# Patient Record
Sex: Male | Born: 1945 | Race: White | Hispanic: No | Marital: Married | Smoking: Former smoker
Health system: Southern US, Community
[De-identification: ages and names within clinical notes are randomized; demographics above are authoritative.]

## PROBLEM LIST (undated history)

## (undated) DIAGNOSIS — D1800 Hemangioma unspecified site: Secondary | ICD-10-CM

## (undated) DIAGNOSIS — C099 Malignant neoplasm of tonsil, unspecified: Secondary | ICD-10-CM

## (undated) DIAGNOSIS — C801 Malignant (primary) neoplasm, unspecified: Secondary | ICD-10-CM

## (undated) DIAGNOSIS — M1990 Unspecified osteoarthritis, unspecified site: Secondary | ICD-10-CM

## (undated) DIAGNOSIS — Z974 Presence of external hearing-aid: Secondary | ICD-10-CM

## (undated) DIAGNOSIS — M75101 Unspecified rotator cuff tear or rupture of right shoulder, not specified as traumatic: Secondary | ICD-10-CM

## (undated) DIAGNOSIS — F4310 Post-traumatic stress disorder, unspecified: Secondary | ICD-10-CM

## (undated) DIAGNOSIS — K219 Gastro-esophageal reflux disease without esophagitis: Secondary | ICD-10-CM

## (undated) DIAGNOSIS — K08109 Complete loss of teeth, unspecified cause, unspecified class: Secondary | ICD-10-CM

## (undated) DIAGNOSIS — M5412 Radiculopathy, cervical region: Secondary | ICD-10-CM

## (undated) DIAGNOSIS — I639 Cerebral infarction, unspecified: Secondary | ICD-10-CM

## (undated) DIAGNOSIS — I6529 Occlusion and stenosis of unspecified carotid artery: Secondary | ICD-10-CM

## (undated) DIAGNOSIS — I10 Essential (primary) hypertension: Secondary | ICD-10-CM

## (undated) DIAGNOSIS — E039 Hypothyroidism, unspecified: Secondary | ICD-10-CM

## (undated) DIAGNOSIS — I2510 Atherosclerotic heart disease of native coronary artery without angina pectoris: Secondary | ICD-10-CM

## (undated) DIAGNOSIS — R7303 Prediabetes: Secondary | ICD-10-CM

## (undated) DIAGNOSIS — I872 Venous insufficiency (chronic) (peripheral): Secondary | ICD-10-CM

## (undated) HISTORY — PX: CORONARY ANGIOPLASTY WITH STENT PLACEMENT: SHX49

## (undated) HISTORY — PX: COLONOSCOPY: SHX174

## (undated) HISTORY — PX: OTHER SURGICAL HISTORY: SHX169

## (undated) HISTORY — PX: RESECTION TONSIL RADICAL: SUR1254

## (undated) HISTORY — PX: CAROTID ENDARTERECTOMY: SUR193

---

## 1978-06-22 DIAGNOSIS — R569 Unspecified convulsions: Secondary | ICD-10-CM

## 1978-06-22 HISTORY — DX: Unspecified convulsions: R56.9

## 2000-04-27 ENCOUNTER — Ambulatory Visit (HOSPITAL_COMMUNITY): Admission: RE | Admit: 2000-04-27 | Discharge: 2000-04-27 | Payer: Self-pay | Admitting: Orthopedic Surgery

## 2000-04-28 ENCOUNTER — Encounter: Payer: Self-pay | Admitting: Orthopedic Surgery

## 2005-06-26 ENCOUNTER — Ambulatory Visit: Payer: Self-pay | Admitting: Otolaryngology

## 2005-07-15 ENCOUNTER — Other Ambulatory Visit: Payer: Self-pay

## 2005-07-21 ENCOUNTER — Ambulatory Visit: Payer: Self-pay | Admitting: Otolaryngology

## 2005-07-30 ENCOUNTER — Ambulatory Visit: Payer: Self-pay | Admitting: Radiation Oncology

## 2005-07-31 ENCOUNTER — Ambulatory Visit: Payer: Self-pay | Admitting: Radiation Oncology

## 2005-08-11 ENCOUNTER — Ambulatory Visit: Payer: Self-pay | Admitting: Vascular Surgery

## 2005-08-20 ENCOUNTER — Ambulatory Visit: Payer: Self-pay | Admitting: Radiation Oncology

## 2005-09-20 ENCOUNTER — Ambulatory Visit: Payer: Self-pay | Admitting: Radiation Oncology

## 2005-10-20 ENCOUNTER — Ambulatory Visit: Payer: Self-pay | Admitting: Radiation Oncology

## 2005-11-20 ENCOUNTER — Ambulatory Visit: Payer: Self-pay | Admitting: Radiation Oncology

## 2005-12-20 ENCOUNTER — Ambulatory Visit: Payer: Self-pay | Admitting: Radiation Oncology

## 2006-01-20 ENCOUNTER — Ambulatory Visit: Payer: Self-pay | Admitting: Radiation Oncology

## 2006-02-26 ENCOUNTER — Ambulatory Visit: Payer: Self-pay | Admitting: Oncology

## 2006-03-02 ENCOUNTER — Ambulatory Visit: Payer: Self-pay | Admitting: Oncology

## 2006-03-22 ENCOUNTER — Ambulatory Visit: Payer: Self-pay | Admitting: Oncology

## 2006-04-22 ENCOUNTER — Ambulatory Visit: Payer: Self-pay | Admitting: Oncology

## 2006-05-22 ENCOUNTER — Ambulatory Visit: Payer: Self-pay | Admitting: Oncology

## 2006-06-24 ENCOUNTER — Ambulatory Visit: Payer: Self-pay | Admitting: Oncology

## 2006-06-28 ENCOUNTER — Ambulatory Visit: Payer: Self-pay | Admitting: Vascular Surgery

## 2006-07-08 ENCOUNTER — Ambulatory Visit: Payer: Self-pay | Admitting: Otolaryngology

## 2006-07-23 ENCOUNTER — Ambulatory Visit: Payer: Self-pay | Admitting: Oncology

## 2006-08-21 ENCOUNTER — Ambulatory Visit: Payer: Self-pay | Admitting: Internal Medicine

## 2006-08-25 ENCOUNTER — Ambulatory Visit: Payer: Self-pay | Admitting: Oncology

## 2006-09-21 ENCOUNTER — Ambulatory Visit: Payer: Self-pay | Admitting: Internal Medicine

## 2006-09-21 ENCOUNTER — Ambulatory Visit: Payer: Self-pay | Admitting: Oncology

## 2006-11-21 ENCOUNTER — Ambulatory Visit: Payer: Self-pay | Admitting: Oncology

## 2006-12-10 ENCOUNTER — Ambulatory Visit: Payer: Self-pay | Admitting: Internal Medicine

## 2006-12-14 ENCOUNTER — Ambulatory Visit: Payer: Self-pay | Admitting: Oncology

## 2006-12-21 ENCOUNTER — Ambulatory Visit: Payer: Self-pay | Admitting: Internal Medicine

## 2006-12-21 ENCOUNTER — Ambulatory Visit: Payer: Self-pay | Admitting: Oncology

## 2006-12-27 ENCOUNTER — Ambulatory Visit: Payer: Self-pay | Admitting: Oncology

## 2007-01-03 ENCOUNTER — Ambulatory Visit: Payer: Self-pay | Admitting: Oncology

## 2007-01-21 ENCOUNTER — Ambulatory Visit: Payer: Self-pay | Admitting: Oncology

## 2007-05-06 IMAGING — PT NM PET TUM IMG INITIAL (PI) SKULL BASE T - THIGH
4 series · 25 of 25 positions shown · non-contrast
Comparison: none

REASON FOR EXAM: Squamous cell CA of tonsil, staging of tonsillar CA
COMMENTS:

[Series 4: pet headneck · axial · 2.0mm · 2.03mm/px · z∈[-248,-86]mm · 16 of 109 slices shown]
[im 1/109]
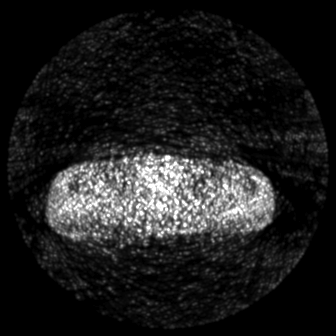
[im 8/109]
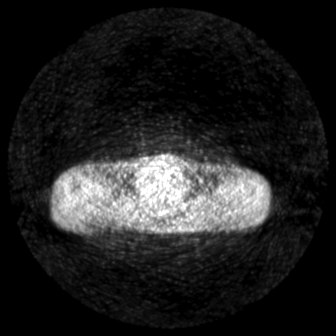
[im 15/109]
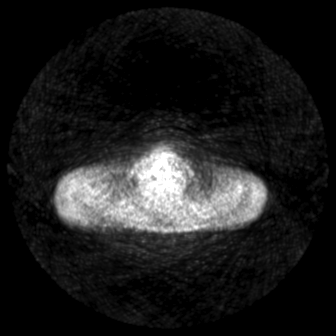
[im 22/109]
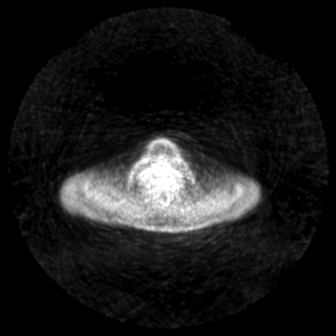
[im 29/109]
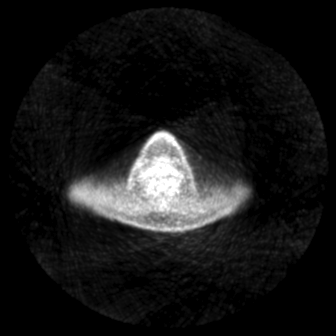
[im 37/109]
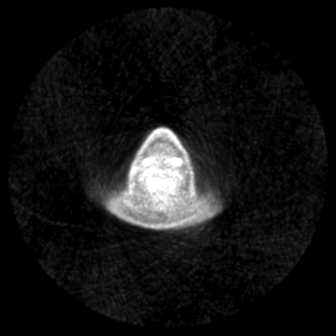
[im 44/109]
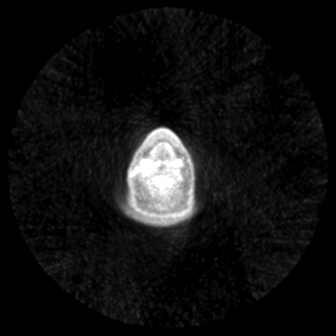
[im 51/109]
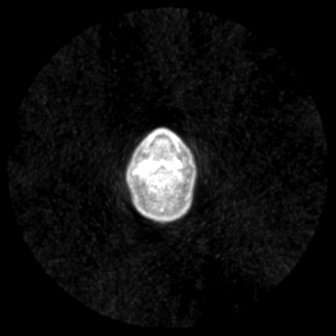
[im 58/109]
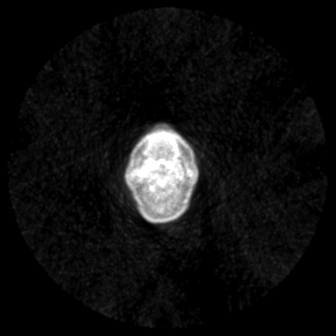
[im 65/109]
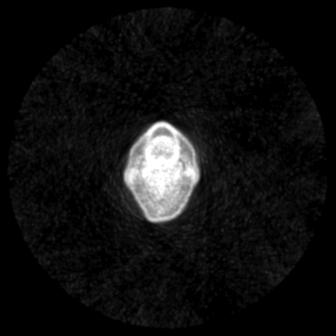
[im 73/109]
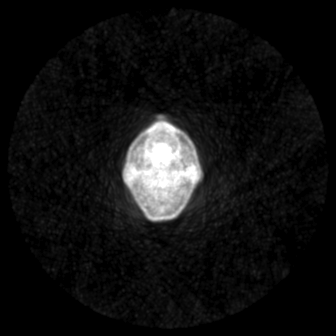
[im 80/109]
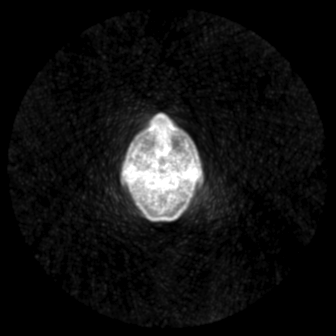
[im 87/109]
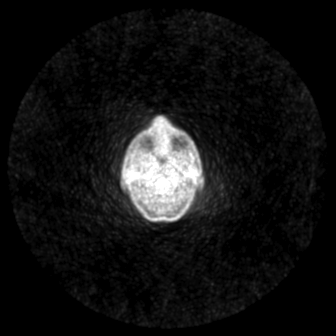
[im 94/109]
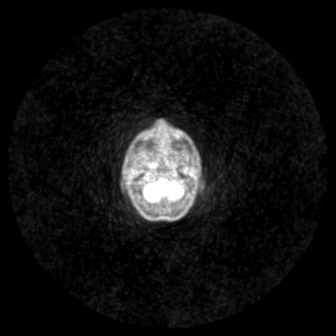
[im 101/109]
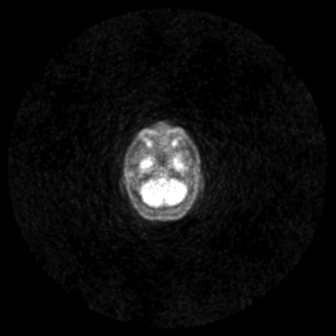
[im 109/109]
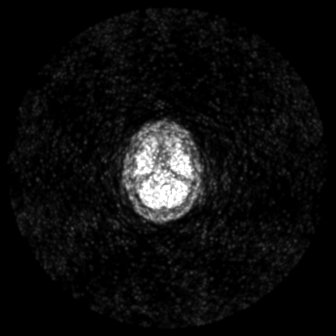

[Series 604: coronals · coronal · 4.0mm · 2.03mm/px · 3 of 21 slices shown]
[im 1/21]
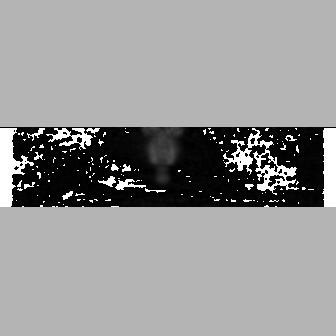
[im 11/21]
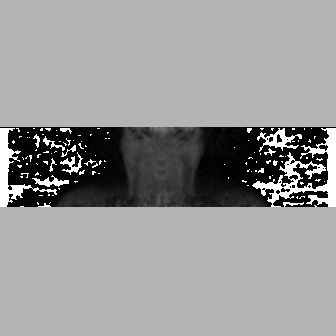
[im 21/21]
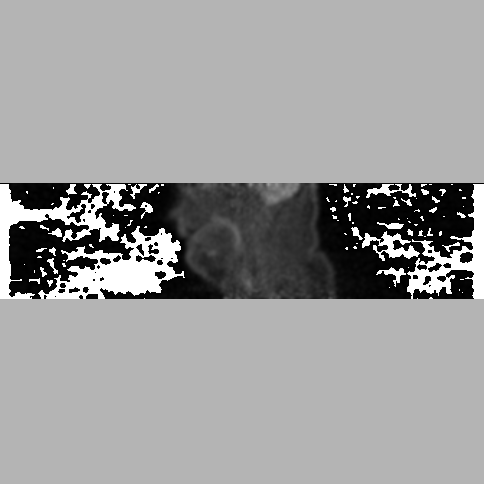

[Series 606: sagittals · axial · 2.0mm · 1.41mm/px · z∈[-168,+173]mm · 3 of 21 slices shown]
[im 1/21]
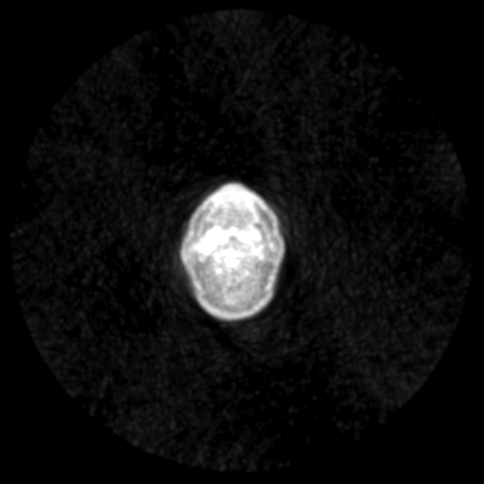
[im 11/21]
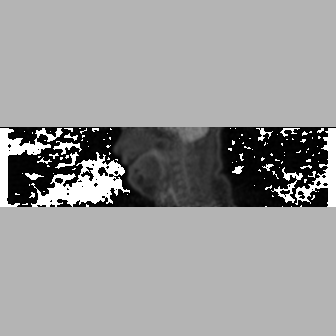
[im 21/21]
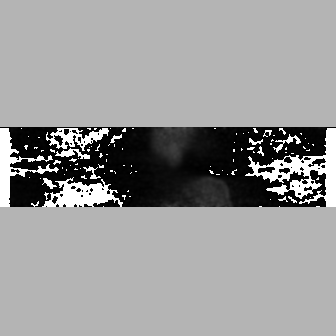

[Series 608: axials · axial · 4.0mm · 2.03mm/px · z∈[-241,+173]mm · 3 of 20 slices shown]
[im 1/20]
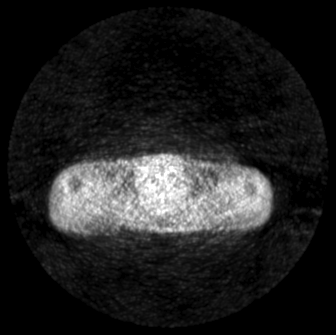
[im 10/20]
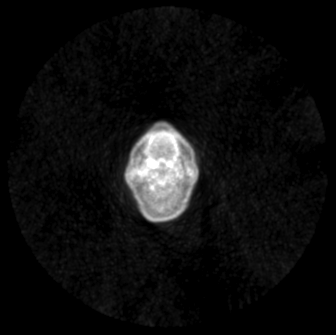
[im 20/20]
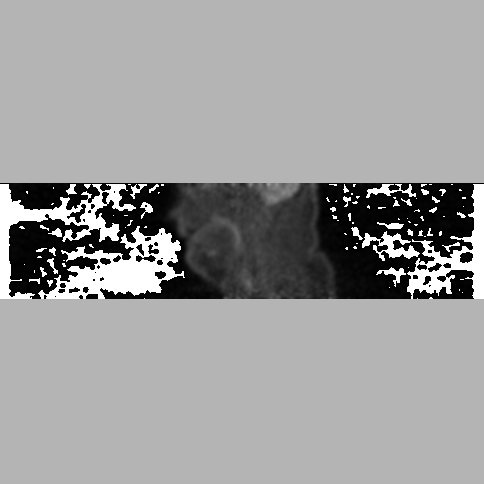

[25 of 25 positions shown; findings below may reference images not displayed]

PROCEDURE:     PET - PET/CT INIT STAG HEAD/NECK CA  - July 31, 2005 [DATE]

RESULT:     Total body PET/CT is obtained status post intravenous
administration of 15.53 mCi of F18. Included with the study is a
non-attenuated total body CT scan as well as coned down images of the neck.

The fasting blood glucose was measured at 90 mg/dl. Appropriate blood pool
images are demonstrated in the region of the brain, liver, heart, kidneys
and urinary bladder. A focal area of hypermetabolic activity is demonstrated
along the tonsillar region on the RIGHT. This area demonstrates an SVU of
3.3 and is consistent with the patient's history of tonsillar carcinoma. A
second focal area of hypermetabolic activity projects within the pelvis on
the RIGHT at the level of the pelvic inlet just anterior to the psoas
muscle. This appears to project in the expected course of the ureter and
demonstrates an SVU of 2.8. Diagnostic considerations at this time is urine
within the distal RIGHT ureter. No definitive masses or nodules are
associated with this area. Attention to this region on subsequent imaging is
recommended. No further focal areas of abnormal hypermetabolic activity are
identified.
IMPRESSION: 1.     Region of hypermetabolic activity within the RIGHT tonsillar region
consistent with neoplastic disease until proven otherwise indicative of the
patient's history.
2.     Findings likely representing urine within the distal RIGHT ureter as
described above. The ureter does appear to be ectatic in this region.
Alternatively, a lymph node within this area cannot be completely excluded
though, as stated above, this area appears to be consistent with radiotracer
within the distal ureter. This finding can be correlated with intravenous
urogram to confirm this is a region of focal ectasia of the RIGHT ureter
versus a small lymph node considering the patient's history. This is if
clinically warranted.
3.     No further regions of abnormal hypermetabolic activity are
appreciated.

## 2007-05-23 ENCOUNTER — Ambulatory Visit: Payer: Self-pay | Admitting: Oncology

## 2007-06-02 ENCOUNTER — Ambulatory Visit: Payer: Self-pay | Admitting: Oncology

## 2007-06-23 ENCOUNTER — Ambulatory Visit: Payer: Self-pay | Admitting: Oncology

## 2007-11-21 ENCOUNTER — Ambulatory Visit: Payer: Self-pay | Admitting: Oncology

## 2007-11-29 ENCOUNTER — Ambulatory Visit: Payer: Self-pay | Admitting: Oncology

## 2007-12-01 ENCOUNTER — Ambulatory Visit: Payer: Self-pay | Admitting: Oncology

## 2007-12-21 ENCOUNTER — Ambulatory Visit: Payer: Self-pay | Admitting: Oncology

## 2008-05-22 ENCOUNTER — Ambulatory Visit: Payer: Self-pay | Admitting: Oncology

## 2008-05-30 ENCOUNTER — Ambulatory Visit: Payer: Self-pay | Admitting: Oncology

## 2008-06-22 ENCOUNTER — Ambulatory Visit: Payer: Self-pay | Admitting: Oncology

## 2008-07-19 ENCOUNTER — Ambulatory Visit: Payer: Self-pay | Admitting: Otolaryngology

## 2009-03-28 ENCOUNTER — Ambulatory Visit: Payer: Self-pay | Admitting: Gastroenterology

## 2009-07-19 ENCOUNTER — Ambulatory Visit: Payer: Self-pay | Admitting: Otolaryngology

## 2011-01-21 ENCOUNTER — Emergency Department: Payer: Self-pay | Admitting: Emergency Medicine

## 2018-05-25 ENCOUNTER — Ambulatory Visit
Admission: RE | Admit: 2018-05-25 | Discharge: 2018-05-25 | Disposition: A | Discharge status: Home / Self Care | Payer: Non-veteran care | Source: Ambulatory Visit | Attending: Plastic Surgery | Admitting: Plastic Surgery

## 2018-05-25 ENCOUNTER — Other Ambulatory Visit: Payer: Self-pay | Admitting: Plastic Surgery

## 2018-05-25 DIAGNOSIS — M67441 Ganglion, right hand: Secondary | ICD-10-CM

## 2018-05-25 DIAGNOSIS — M19041 Primary osteoarthritis, right hand: Secondary | ICD-10-CM | POA: Diagnosis not present

## 2018-05-30 ENCOUNTER — Other Ambulatory Visit: Payer: Self-pay

## 2018-05-30 DIAGNOSIS — Z86010 Personal history of colonic polyps: Secondary | ICD-10-CM

## 2018-06-20 ENCOUNTER — Telehealth: Payer: Self-pay | Admitting: Gastroenterology

## 2018-06-20 NOTE — Telephone Encounter (Signed)
Pt wife is calling to r/s this procedure please call pt to r/s

## 2018-06-23 NOTE — Telephone Encounter (Signed)
Returned patients call to reschedule colonoscopy.  LVM for him to call me back to get a new date. 06/24/18 procedure has been canceled with Trish in Endoscopy.  No Cancellation Fee Wife called on 06/20/18  Thanks Marcelino Duster

## 2018-06-24 ENCOUNTER — Encounter: Admission: RE | Payer: Self-pay | Source: Home / Self Care

## 2018-06-24 ENCOUNTER — Ambulatory Visit: Admission: RE | Admit: 2018-06-24 | Payer: Non-veteran care | Source: Home / Self Care | Admitting: Gastroenterology

## 2018-06-24 SURGERY — COLONOSCOPY WITH PROPOFOL
Anesthesia: General

## 2018-10-20 ENCOUNTER — Encounter: Admission: RE | Payer: Self-pay | Source: Home / Self Care

## 2018-10-20 ENCOUNTER — Ambulatory Visit: Admission: RE | Admit: 2018-10-20 | Payer: Non-veteran care | Source: Home / Self Care | Admitting: Gastroenterology

## 2018-10-20 SURGERY — COLONOSCOPY WITH PROPOFOL
Anesthesia: General

## 2019-05-22 ENCOUNTER — Other Ambulatory Visit: Payer: Self-pay

## 2019-05-22 DIAGNOSIS — Z20822 Contact with and (suspected) exposure to covid-19: Secondary | ICD-10-CM

## 2019-05-25 LAB — NOVEL CORONAVIRUS, NAA: SARS-CoV-2, NAA: NOT DETECTED

## 2019-05-26 ENCOUNTER — Telehealth: Payer: Self-pay | Admitting: General Practice

## 2019-05-26 NOTE — Telephone Encounter (Signed)
Patient called in for his negative covid result

## 2019-07-06 ENCOUNTER — Ambulatory Visit: Payer: Non-veteran care | Attending: Internal Medicine

## 2019-07-06 DIAGNOSIS — Z20822 Contact with and (suspected) exposure to covid-19: Secondary | ICD-10-CM

## 2019-07-07 LAB — NOVEL CORONAVIRUS, NAA: SARS-CoV-2, NAA: NOT DETECTED

## 2019-07-08 ENCOUNTER — Telehealth: Payer: Self-pay | Admitting: General Practice

## 2019-07-08 NOTE — Telephone Encounter (Signed)
Pt called to get COVID results.  Made him aware those are negative.

## 2022-08-31 ENCOUNTER — Encounter: Payer: Self-pay | Admitting: *Deleted

## 2022-09-01 ENCOUNTER — Encounter: Payer: Self-pay | Admitting: *Deleted

## 2022-09-01 ENCOUNTER — Ambulatory Visit
Admission: RE | Admit: 2022-09-01 | Discharge: 2022-09-01 | Disposition: A | Discharge status: Home / Self Care | Payer: No Typology Code available for payment source | Attending: Gastroenterology | Admitting: Gastroenterology

## 2022-09-01 ENCOUNTER — Ambulatory Visit: Payer: No Typology Code available for payment source | Admitting: Anesthesiology

## 2022-09-01 ENCOUNTER — Encounter
Admission: RE | Disposition: A | Discharge status: Home / Self Care | Payer: Self-pay | Source: Home / Self Care | Attending: Gastroenterology

## 2022-09-01 DIAGNOSIS — E785 Hyperlipidemia, unspecified: Secondary | ICD-10-CM | POA: Diagnosis not present

## 2022-09-01 DIAGNOSIS — E039 Hypothyroidism, unspecified: Secondary | ICD-10-CM | POA: Insufficient documentation

## 2022-09-01 DIAGNOSIS — K640 First degree hemorrhoids: Secondary | ICD-10-CM | POA: Diagnosis not present

## 2022-09-01 DIAGNOSIS — K31A11 Gastric intestinal metaplasia without dysplasia, involving the antrum: Secondary | ICD-10-CM | POA: Diagnosis not present

## 2022-09-01 DIAGNOSIS — Z8673 Personal history of transient ischemic attack (TIA), and cerebral infarction without residual deficits: Secondary | ICD-10-CM | POA: Diagnosis not present

## 2022-09-01 DIAGNOSIS — K2970 Gastritis, unspecified, without bleeding: Secondary | ICD-10-CM | POA: Insufficient documentation

## 2022-09-01 DIAGNOSIS — K219 Gastro-esophageal reflux disease without esophagitis: Secondary | ICD-10-CM | POA: Insufficient documentation

## 2022-09-01 DIAGNOSIS — Z9049 Acquired absence of other specified parts of digestive tract: Secondary | ICD-10-CM | POA: Diagnosis not present

## 2022-09-01 DIAGNOSIS — Z1211 Encounter for screening for malignant neoplasm of colon: Secondary | ICD-10-CM | POA: Diagnosis present

## 2022-09-01 DIAGNOSIS — I2510 Atherosclerotic heart disease of native coronary artery without angina pectoris: Secondary | ICD-10-CM | POA: Diagnosis not present

## 2022-09-01 DIAGNOSIS — I10 Essential (primary) hypertension: Secondary | ICD-10-CM | POA: Insufficient documentation

## 2022-09-01 DIAGNOSIS — Z86010 Personal history of colonic polyps: Secondary | ICD-10-CM | POA: Insufficient documentation

## 2022-09-01 DIAGNOSIS — Z87891 Personal history of nicotine dependence: Secondary | ICD-10-CM | POA: Insufficient documentation

## 2022-09-01 HISTORY — DX: Atherosclerotic heart disease of native coronary artery without angina pectoris: I25.10

## 2022-09-01 HISTORY — PX: COLONOSCOPY WITH PROPOFOL: SHX5780

## 2022-09-01 HISTORY — PX: ESOPHAGOGASTRODUODENOSCOPY (EGD) WITH PROPOFOL: SHX5813

## 2022-09-01 HISTORY — DX: Unspecified osteoarthritis, unspecified site: M19.90

## 2022-09-01 HISTORY — DX: Hypothyroidism, unspecified: E03.9

## 2022-09-01 HISTORY — DX: Malignant (primary) neoplasm, unspecified: C80.1

## 2022-09-01 HISTORY — DX: Gastro-esophageal reflux disease without esophagitis: K21.9

## 2022-09-01 HISTORY — DX: Cerebral infarction, unspecified: I63.9

## 2022-09-01 SURGERY — COLONOSCOPY WITH PROPOFOL
Anesthesia: General

## 2022-09-01 MED ORDER — DEXMEDETOMIDINE HCL IN NACL 80 MCG/20ML IV SOLN
INTRAVENOUS | Status: AC
  Filled 2022-09-01: qty 20

## 2022-09-01 MED ORDER — PROPOFOL 500 MG/50ML IV EMUL
INTRAVENOUS | Status: DC | PRN
  Administered 2022-09-01: 175 ug/kg/min via INTRAVENOUS

## 2022-09-01 MED ORDER — PHENYLEPHRINE 80 MCG/ML (10ML) SYRINGE FOR IV PUSH (FOR BLOOD PRESSURE SUPPORT)
PREFILLED_SYRINGE | INTRAVENOUS | Status: AC
  Filled 2022-09-01: qty 10

## 2022-09-01 MED ORDER — PROPOFOL 1000 MG/100ML IV EMUL
INTRAVENOUS | Status: AC
  Filled 2022-09-01: qty 100

## 2022-09-01 MED ORDER — LIDOCAINE HCL (PF) 2 % IJ SOLN
INTRAMUSCULAR | Status: AC
  Filled 2022-09-01: qty 5

## 2022-09-01 MED ORDER — LIDOCAINE HCL (CARDIAC) PF 100 MG/5ML IV SOSY
PREFILLED_SYRINGE | INTRAVENOUS | Status: DC | PRN
  Administered 2022-09-01: 60 mg via INTRAVENOUS

## 2022-09-01 MED ORDER — PHENYLEPHRINE HCL (PRESSORS) 10 MG/ML IV SOLN
INTRAVENOUS | Status: DC | PRN
  Administered 2022-09-01 (×2): 80 ug via INTRAVENOUS

## 2022-09-01 MED ORDER — DEXMEDETOMIDINE HCL IN NACL 200 MCG/50ML IV SOLN
INTRAVENOUS | Status: DC | PRN
  Administered 2022-09-01: 12 ug via INTRAVENOUS

## 2022-09-01 MED ORDER — GLYCOPYRROLATE 0.2 MG/ML IJ SOLN
INTRAMUSCULAR | Status: AC
  Filled 2022-09-01: qty 1

## 2022-09-01 MED ORDER — EPHEDRINE SULFATE (PRESSORS) 50 MG/ML IJ SOLN
INTRAMUSCULAR | Status: DC | PRN
  Administered 2022-09-01: 10 mg via INTRAVENOUS

## 2022-09-01 MED ORDER — PROPOFOL 10 MG/ML IV BOLUS
INTRAVENOUS | Status: DC | PRN
  Administered 2022-09-01: 10 mg via INTRAVENOUS
  Administered 2022-09-01: 80 mg via INTRAVENOUS

## 2022-09-01 MED ORDER — EPHEDRINE 5 MG/ML INJ
INTRAVENOUS | Status: AC
  Filled 2022-09-01: qty 5

## 2022-09-01 MED ORDER — GLYCOPYRROLATE 0.2 MG/ML IJ SOLN
INTRAMUSCULAR | Status: DC | PRN
  Administered 2022-09-01: .2 mg via INTRAVENOUS

## 2022-09-01 MED ORDER — SODIUM CHLORIDE 0.9 % IV SOLN: INTRAVENOUS | Status: DC

## 2022-09-01 NOTE — Anesthesia Preprocedure Evaluation (Addendum)
Anesthesia Evaluation  Patient identified by MRN, date of birth, ID band Patient awake    Reviewed: Allergy & Precautions, NPO status , Patient's Chart, lab work & pertinent test results  Airway Mallampati: III  TM Distance: >3 FB Neck ROM: full    Dental  (+) Upper Dentures, Lower Dentures   Pulmonary former smoker   Pulmonary exam normal        Cardiovascular + CAD  Normal cardiovascular exam     Neuro/Psych CVA, No Residual Symptoms  negative psych ROS   GI/Hepatic Neg liver ROS,GERD  Medicated,,  Endo/Other  Hypothyroidism    Renal/GU negative Renal ROS  negative genitourinary   Musculoskeletal  (+) Arthritis ,    Abdominal   Peds  Hematology negative hematology ROS (+)   Anesthesia Other Findings Past Medical History: No date: Arthritis No date: Cancer ([place])     Comment:  head and next cancer 66yr ago No date: Coronary artery disease No date: GERD (gastroesophageal reflux disease) No date: Hypothyroidism No date: Stroke ([facility]  Past Surgical History: No date: COLONOSCOPY     Comment:  x3 No date: CORONARY ANGIOPLASTY WITH STENT PLACEMENT     Comment:  x4 yrs ago No date: portacath removal No date: RESECTION TONSIL RADICAL  BMI    Body Mass Index: 30.48 kg/m      Reproductive/Obstetrics negative OB ROS                              Anesthesia Physical Anesthesia Plan  ASA: 2  Anesthesia Plan: General   Post-op Pain Management:    Induction: Intravenous  PONV Risk Score and Plan: Propofol infusion and TIVA  Airway Management Planned: Natural Airway and Nasal Cannula  Additional Equipment:   Intra-op Plan:   Post-operative Plan:   Informed Consent: I have reviewed the patients History and Physical, chart, labs and discussed the procedure including the risks, benefits and alternatives for the proposed anesthesia with the patient or authorized representative  who has indicated his/her understanding and acceptance.     Dental Advisory Given  Plan Discussed with: Anesthesiologist, CRNA and Surgeon  Anesthesia Plan Comments: (Patient consented for risks of anesthesia including but not limited to:  - adverse reactions to medications - risk of airway placement if required - damage to eyes, teeth, lips or other oral mucosa - nerve damage due to positioning  - sore throat or hoarseness - Damage to heart, brain, nerves, lungs, other parts of body or loss of life  Patient voiced understanding.)        Anesthesia Quick Evaluation

## 2022-09-01 NOTE — H&P (Signed)
Outpatient short stay form Pre-procedure 09/01/2022  Lesly Rubenstein, MD  Primary Physician: Benedict  Reason for visit:  GERD/History of polyps  History of present illness:    77 y/o gentleman with history of tonsillar cancer s/p chemo/radiation/surgery, hypertension, hypothyroidism, and HLD here for EGD for GERD and colonoscopy for several Ta's on colonoscopy done in 2020. No blood thinners. No family history of GI malignancies. History of cholecystectomy.    Current Facility-Administered Medications:    0.9 %  sodium chloride infusion, , Intravenous, Continuous, Gracia Saggese, Hilton Cork, MD, Last Rate: 20 mL/hr at 09/01/22 0814, New Bag at 09/01/22 0814  Medications Prior to Admission  Medication Sig Dispense Refill Last Dose   aspirin EC 81 MG tablet Take 81 mg by mouth daily. Swallow whole.      atorvastatin (LIPITOR) 40 MG tablet Take 40 mg by mouth daily.      celecoxib (CELEBREX) 100 MG capsule Take 100 mg by mouth 2 (two) times daily.      diltiazem (CARDIZEM CD) 240 MG 24 hr capsule Take 240 mg by mouth daily.   08/31/2022   fluticasone (FLONASE) 50 MCG/ACT nasal spray Place 1 spray into both nostrils daily.      levothyroxine (SYNTHROID) 50 MCG tablet Take 50 mcg by mouth daily before breakfast.   08/31/2022   methocarbamol (ROBAXIN) 500 MG tablet Take 500 mg by mouth every 8 (eight) hours as needed for muscle spasms.      pantoprazole (PROTONIX) 40 MG tablet Take 40 mg by mouth daily.   08/31/2022   sertraline (ZOLOFT) 100 MG tablet Take 100 mg by mouth daily.        Not on File   Past Medical History:  Diagnosis Date   Arthritis    Cancer ([place])    head and next cancer 26yr ago   Coronary artery disease    GERD (gastroesophageal reflux disease)    Hypothyroidism    Stroke ([facility]     Review of systems:  Otherwise negative.    Physical Exam  Gen: Alert, oriented. Appears stated age.  HEENT: PERRLA. Lungs: No respiratory distress CV: RRR Abd: soft,  benign, no masses Ext: No edema    Planned procedures: Proceed with EGD/colonoscopy. The patient understands the nature of the planned procedure, indications, risks, alternatives and potential complications including but not limited to bleeding, infection, perforation, damage to internal organs and possible oversedation/side effects from anesthesia. The patient agrees and gives consent to proceed.  Please refer to procedure notes for findings, recommendations and patient disposition/instructions.     CLesly Rubenstein MD [facility]

## 2022-09-01 NOTE — Anesthesia Postprocedure Evaluation (Addendum)
Anesthesia Post Note  Patient: Ricky Hart  Procedure(s) Performed: COLONOSCOPY WITH PROPOFOL ESOPHAGOGASTRODUODENOSCOPY (EGD) WITH PROPOFOL  Patient location during evaluation: Endoscopy Anesthesia Type: General Level of consciousness: awake and alert Pain management: pain level controlled Vital Signs Assessment: post-procedure vital signs reviewed and stable Respiratory status: spontaneous breathing, nonlabored ventilation and respiratory function stable Cardiovascular status: blood pressure returned to baseline and stable Postop Assessment: no apparent nausea or vomiting Anesthetic complications: no   No notable events documented.   Last Vitals:  Vitals:   09/01/22 0941 09/01/22 0951  BP: 121/61 134/68  Pulse: 63 64  Resp: (!) 22 (!) 24  Temp:    SpO2: 99% 99%    Last Pain:  Vitals:   09/01/22 0951  TempSrc:   PainSc: 0-No pain                 Alphonsus Sias

## 2022-09-01 NOTE — Op Note (Signed)
[facility] Gastroenterology Patient Name: Ricky Hart Procedure Date: 09/01/2022 8:49 AM MRN: [id number] Account #: [id number] Date of Birth: 1945/11/11 Admit Type: Outpatient Age: 77 Room: [facility] ENDO ROOM 1 Gender: Male Note Status: Finalized Instrument Name: Jasper Riling [id number] Procedure:             Colonoscopy Indications:           Surveillance: Personal history of adenomatous polyps                         on last colonoscopy > 3 years ago Providers:             Andrey Farmer MD, MD Medicines:             Monitored Anesthesia Care Complications:         No immediate complications. Procedure:             Pre-Anesthesia Assessment:                        - Prior to the procedure, a History and Physical was                         performed, and patient medications and allergies were                         reviewed. The patient is competent. The risks and                         benefits of the procedure and the sedation options and                         risks were discussed with the patient. All questions                         were answered and informed consent was obtained.                         Patient identification and proposed procedure were                         verified by the physician, the nurse, the                         anesthesiologist, the anesthetist and the technician                         in the endoscopy suite. Mental Status Examination:                         alert and oriented. Airway Examination: normal                         oropharyngeal airway and neck mobility. Respiratory                         Examination: clear to auscultation. CV Examination:                         normal. Prophylactic Antibiotics: The patient does not  require prophylactic antibiotics. Prior                         Anticoagulants: The patient has taken no anticoagulant                         or antiplatelet agents. ASA  Grade Assessment: II - A                         patient with mild systemic disease. After reviewing                         the risks and benefits, the patient was deemed in                         satisfactory condition to undergo the procedure. The                         anesthesia plan was to use monitored anesthesia care                         (MAC). Immediately prior to administration of                         medications, the patient was re-assessed for adequacy                         to receive sedatives. The heart rate, respiratory                         rate, oxygen saturations, blood pressure, adequacy of                         pulmonary ventilation, and response to care were                         monitored throughout the procedure. The physical                         status of the patient was re-assessed after the                         procedure.                        After obtaining informed consent, the colonoscope was                         passed under direct vision. Throughout the procedure,                         the patient's blood pressure, pulse, and oxygen                         saturations were monitored continuously. The                         Colonoscope was introduced through the anus and  advanced to the the cecum, identified by the ileocecal                         valve. The colonoscopy was somewhat difficult due to a                         redundant colon. The patient tolerated the procedure                         well. The quality of the bowel preparation was fair.                         The ileocecal valve and the rectum were photographed. Findings:      The perianal and digital rectal examinations were normal.      Internal hemorrhoids were found during retroflexion. The hemorrhoids       were Grade I (internal hemorrhoids that do not prolapse).      The exam was otherwise without abnormality on direct and  retroflexion       views. Impression:            - Preparation of the colon was fair.                        - Internal hemorrhoids.                        - The examination was otherwise normal on direct and                         retroflexion views.                        - No specimens collected. Recommendation:        - Discharge patient to home.                        - Resume previous diet.                        - Continue present medications.                        - Repeat colonoscopy in 1 year because the bowel                         preparation was suboptimal.                        - Return to referring physician as previously                         scheduled. Procedure Code(s):     --- Professional ---                        [id number], Colorectal cancer screening; colonoscopy on                         individual at high risk Diagnosis Code(s):     --- Professional ---  Z86.010, Personal history of colonic polyps                        K64.0, First degree hemorrhoids CPT copyright 2022 American Medical Association. All rights reserved. The codes documented in this report are preliminary and upon coder review may  be revised to meet current compliance requirements. Andrey Farmer MD, MD 09/01/2022 9:40:22 AM Number of Addenda: 0 Note Initiated On: 09/01/2022 8:49 AM Scope Withdrawal Time: 0 hours 9 minutes 15 seconds  Total Procedure Duration: 0 hours 17 minutes 38 seconds  Estimated Blood Loss:  Estimated blood loss: none.      [facility]

## 2022-09-01 NOTE — Interval H&P Note (Signed)
History and Physical Interval Note:  09/01/2022 8:55 AM  Ricky Hart  has presented today for surgery, with the diagnosis of GERD,PHARYNGOESOPHAGEAL DYSPHAGIA,HX OF ADENOMATOUS POLYP OF COLON.  The various methods of treatment have been discussed with the patient and family. After consideration of risks, benefits and other options for treatment, the patient has consented to  Procedure(s): COLONOSCOPY WITH PROPOFOL (N/A) ESOPHAGOGASTRODUODENOSCOPY (EGD) WITH PROPOFOL (N/A) as a surgical intervention.  The patient's history has been reviewed, patient examined, no change in status, stable for surgery.  I have reviewed the patient's chart and labs.  Questions were answered to the patient's satisfaction.     Lesly Rubenstein  Ok to proceed with EGD/Colonoscopy

## 2022-09-01 NOTE — Transfer of Care (Signed)
Immediate Anesthesia Transfer of Care Note  Patient: Ricky Hart  Procedure(s) Performed: COLONOSCOPY WITH PROPOFOL ESOPHAGOGASTRODUODENOSCOPY (EGD) WITH PROPOFOL  Patient Location: PACU  Anesthesia Type:General  Level of Consciousness: drowsy  Airway & Oxygen Therapy: Patient Spontanous Breathing  Post-op Assessment: Report given to RN and Post -op Vital signs reviewed and stable  Post vital signs: Reviewed and stable  Last Vitals:  Vitals Value Taken Time  BP 116/74 0932  Temp 35.9 0932  Pulse 70 09/01/22 0933  Resp 18 09/01/22 0934  SpO2 95 % 09/01/22 0933  Vitals shown include unvalidated device data.  Last Pain:  Vitals:   09/01/22 0931  TempSrc: Temporal  PainSc: Asleep         Complications: No notable events documented.

## 2022-09-01 NOTE — Op Note (Signed)
[facility] Gastroenterology Patient Name: Ricky Hart Procedure Date: 09/01/2022 8:50 AM MRN: [id number] Account #: [id number] Date of Birth: Jan 10, 1946 Admit Type: Outpatient Age: 77 Room: [facility] ENDO ROOM 1 Gender: Male Note Status: Finalized Instrument Name: Upper Endoscope [id number] Procedure:             Upper GI endoscopy Indications:           Gastro-esophageal reflux disease Providers:             Andrey Farmer MD, MD Medicines:             Monitored Anesthesia Care Complications:         No immediate complications. Estimated blood loss:                         Minimal. Procedure:             Pre-Anesthesia Assessment:                        - Prior to the procedure, a History and Physical was                         performed, and patient medications and allergies were                         reviewed. The patient is competent. The risks and                         benefits of the procedure and the sedation options and                         risks were discussed with the patient. All questions                         were answered and informed consent was obtained.                         Patient identification and proposed procedure were                         verified by the physician, the nurse, the                         anesthesiologist, the anesthetist and the technician                         in the endoscopy suite. Mental Status Examination:                         alert and oriented. Airway Examination: normal                         oropharyngeal airway and neck mobility. Respiratory                         Examination: clear to auscultation. CV Examination:                         normal. Prophylactic Antibiotics: The patient does not  require prophylactic antibiotics. Prior                         Anticoagulants: The patient has taken no anticoagulant                         or antiplatelet agents. ASA Grade  Assessment: II - A                         patient with mild systemic disease. After reviewing                         the risks and benefits, the patient was deemed in                         satisfactory condition to undergo the procedure. The                         anesthesia plan was to use monitored anesthesia care                         (MAC). Immediately prior to administration of                         medications, the patient was re-assessed for adequacy                         to receive sedatives. The heart rate, respiratory                         rate, oxygen saturations, blood pressure, adequacy of                         pulmonary ventilation, and response to care were                         monitored throughout the procedure. The physical                         status of the patient was re-assessed after the                         procedure.                        After obtaining informed consent, the endoscope was                         passed under direct vision. Throughout the procedure,                         the patient's blood pressure, pulse, and oxygen                         saturations were monitored continuously. The Endoscope                         was introduced through the mouth, and advanced to the  second part of duodenum. The upper GI endoscopy was                         accomplished without difficulty. The patient tolerated                         the procedure well. Findings:      The examined esophagus was normal.      Patchy moderate inflammation characterized by erythema and granularity       was found in the gastric body and in the gastric antrum. Biopsies were       taken with a cold forceps for Helicobacter pylori testing. Estimated       blood loss was minimal.      The examined duodenum was normal. Impression:            - Normal esophagus.                        - Gastritis. Biopsied.                        -  Normal examined duodenum. Recommendation:        - Discharge patient to home.                        - Resume previous diet.                        - Continue present medications.                        - Await pathology results.                        - Return to referring physician as previously                         scheduled. Procedure Code(s):     --- Professional ---                        [phone], Esophagogastroduodenoscopy, flexible,                         transoral; with biopsy, single or multiple Diagnosis Code(s):     --- Professional ---                        K29.70, Gastritis, unspecified, without bleeding                        K21.9, Gastro-esophageal reflux disease without                         esophagitis CPT copyright 2022 American Medical Association. All rights reserved. The codes documented in this report are preliminary and upon coder review may  be revised to meet current compliance requirements. Andrey Farmer MD, MD 09/01/2022 9:37:27 AM Number of Addenda: 0 Note Initiated On: 09/01/2022 8:50 AM Estimated Blood Loss:  Estimated blood loss was minimal.      [facility]

## 2022-09-02 ENCOUNTER — Encounter: Payer: Self-pay | Admitting: Gastroenterology

## 2022-09-02 LAB — SURGICAL PATHOLOGY

## 2023-11-21 ENCOUNTER — Emergency Department

## 2023-11-21 ENCOUNTER — Other Ambulatory Visit: Payer: Self-pay

## 2023-11-21 ENCOUNTER — Emergency Department
Admission: EM | Admit: 2023-11-21 | Discharge: 2023-11-21 | Disposition: A | Discharge status: Home / Self Care | Attending: Emergency Medicine | Admitting: Emergency Medicine

## 2023-11-21 DIAGNOSIS — S0990XA Unspecified injury of head, initial encounter: Secondary | ICD-10-CM | POA: Diagnosis present

## 2023-11-21 DIAGNOSIS — W228XXA Striking against or struck by other objects, initial encounter: Secondary | ICD-10-CM | POA: Diagnosis not present

## 2023-11-21 NOTE — ED Notes (Signed)
 Patient denies blurry vision. Patient states sometimes he is unsteady on his feet if he gets up too quickly. Patient also states he has been sleepy today. Injury occurred yesterday.

## 2023-11-21 NOTE — Discharge Instructions (Signed)
 Your CT scans were normal.  Please follow-up with your outpatient please return for any new, worsening, symptoms or other concerns.  It was a pleasure caring for you today.

## 2023-11-21 NOTE — ED Provider Notes (Signed)
 [facility] Provider Note    Event Date/Time   First MD Initiated Contact with Patient 11/21/23 1458     (approximate)   History   Head Injury   HPI  Ricky Hart is a 78 y.o. male who presents today for evaluation of head injury.  Patient reports that he turned around quickly and hit his head on a door frame.  He reports that he has been feeling very nauseated since the event.  He did not have LOC.  Did not fall to the ground.  He is not anticoagulated.  He denies weakness or paresthesias.  No visual changes.  There are no active problems to display for this patient.         Physical Exam   Triage Vital Signs: ED Triage Vitals  Encounter Vitals Group     BP 11/21/23 1442 (!) 159/78     Systolic BP Percentile --      Diastolic BP Percentile --      Pulse Rate 11/21/23 1442 69     Resp 11/21/23 1442 18     Temp 11/21/23 1442 98.6 F (37 C)     Temp Source 11/21/23 1442 Oral     SpO2 11/21/23 1442 97 %     Weight 11/21/23 1440 181 lb (82.1 kg)     Height 11/21/23 1440 5\' 6"  (1.676 m)     Head Circumference --      Peak Flow --      Pain Score 11/21/23 1440 7     Pain Loc --      Pain Education --      Exclude from Growth Chart --     Most recent vital signs: Vitals:   11/21/23 1442  BP: (!) 159/78  Pulse: 69  Resp: 18  Temp: 98.6 F (37 C)  SpO2: 97%    Physical Exam Vitals and nursing note reviewed.  Constitutional:      General: Awake and alert. No acute distress.    Appearance: Normal appearance. The patient is normal weight.  HENT:     Head: Normocephalic and atraumatic.     Mouth: Mucous membranes are moist.  Eyes:     General: PERRL. Normal EOMs        Right eye: No discharge.        Left eye: No discharge.     Conjunctiva/sclera: Conjunctivae normal.  Cardiovascular:     Rate and Rhythm: Normal rate and regular rhythm.     Pulses: Normal pulses.  Pulmonary:     Effort: Pulmonary effort is normal. No  respiratory distress.     Breath sounds: Normal breath sounds.  Abdominal:     Abdomen is soft. There is no abdominal tenderness. No rebound or guarding. No distention. Musculoskeletal:        General: No swelling. Normal range of motion.     Cervical back: Normal range of motion and neck supple. No midline cervical spine tenderness.  Full range of motion of neck.  Negative Spurling test.  Negative Lhermitte sign.  Normal strength and sensation in bilateral upper extremities. Normal grip strength bilaterally.  Normal intrinsic muscle function of the hand bilaterally.  Normal radial pulses bilaterally. Skin:    General: Skin is warm and dry.     Capillary Refill: Capillary refill takes less than 2 seconds.     Findings: No rash.  Neurological:     Mental Status: The patient is awake and alert.  Neurological:  GCS 15 alert and oriented x3 Normal speech, no expressive or receptive aphasia or dysarthria Cranial nerves II through XII intact Normal visual fields 5 out of 5 strength in all 4 extremities with intact sensation throughout No extremity drift Normal finger-to-nose testing, no limb or truncal ataxia      ED Results / Procedures / Treatments   Labs (all labs ordered are listed, but only abnormal results are displayed) Labs Reviewed - No data to display   EKG     RADIOLOGY I independently reviewed and interpreted imaging and agree with radiologists findings.     PROCEDURES:  Critical Care performed:   Procedures   MEDICATIONS ORDERED IN ED: Medications - No data to display   IMPRESSION / MDM / ASSESSMENT AND PLAN / ED COURSE  I reviewed the triage vital signs and the nursing notes.   Differential diagnosis includes, but is not limited to, concussion, contusion, intracranial hemorrhage, cervical spine injury.  Patient is awake and alert, hemodynamically stable and afebrile.  He is neurologically intact.  CT head and neck obtained per [place] criteria are  normal.  Patient is a GCS of 15, answers questions appropriately and is nontoxic in appearance.  Suspect contusion versus concussion is most likely cause.  We discussed return precautions and outpatient follow-up.  Patient understands and agrees with plan.  He was discharged in stable condition.   Patient's presentation is most consistent with acute complicated illness / injury requiring diagnostic workup.    FINAL CLINICAL IMPRESSION(S) / ED DIAGNOSES   Final diagnoses:  Closed head injury, initial encounter     Rx / DC Orders   ED Discharge Orders     None        Note:  This document was prepared using Dragon voice recognition software and may include unintentional dictation errors.   Vonna Brabson E, PA-C 11/21/23 1652    Kandee Orion, MD 11/21/23 1730

## 2023-11-21 NOTE — ED Notes (Signed)
 Patient taken to CT scan.

## 2023-11-21 NOTE — ED Triage Notes (Signed)
 Pt sts that he was in his closet and bent over to pick something up and he stood up and hit his head on the door frame. Pt sts that he still have a head ache and feels swimming headed. Pt denies any LOC or blood thinners.

## 2023-11-29 ENCOUNTER — Other Ambulatory Visit: Payer: Self-pay

## 2023-11-29 ENCOUNTER — Ambulatory Visit
Admission: RE | Admit: 2023-11-29 | Discharge: 2023-11-29 | Disposition: A | Discharge status: Home / Self Care | Attending: Gastroenterology | Admitting: Gastroenterology

## 2023-11-29 ENCOUNTER — Ambulatory Visit: Admitting: Anesthesiology

## 2023-11-29 ENCOUNTER — Encounter
Admission: RE | Disposition: A | Discharge status: Home / Self Care | Payer: Self-pay | Source: Home / Self Care | Attending: Gastroenterology

## 2023-11-29 DIAGNOSIS — E039 Hypothyroidism, unspecified: Secondary | ICD-10-CM | POA: Diagnosis not present

## 2023-11-29 DIAGNOSIS — I10 Essential (primary) hypertension: Secondary | ICD-10-CM | POA: Insufficient documentation

## 2023-11-29 DIAGNOSIS — K219 Gastro-esophageal reflux disease without esophagitis: Secondary | ICD-10-CM | POA: Insufficient documentation

## 2023-11-29 DIAGNOSIS — D123 Benign neoplasm of transverse colon: Secondary | ICD-10-CM | POA: Insufficient documentation

## 2023-11-29 DIAGNOSIS — Z85818 Personal history of malignant neoplasm of other sites of lip, oral cavity, and pharynx: Secondary | ICD-10-CM | POA: Insufficient documentation

## 2023-11-29 DIAGNOSIS — Z87891 Personal history of nicotine dependence: Secondary | ICD-10-CM | POA: Insufficient documentation

## 2023-11-29 DIAGNOSIS — E785 Hyperlipidemia, unspecified: Secondary | ICD-10-CM | POA: Diagnosis not present

## 2023-11-29 DIAGNOSIS — Z923 Personal history of irradiation: Secondary | ICD-10-CM | POA: Insufficient documentation

## 2023-11-29 DIAGNOSIS — Z79899 Other long term (current) drug therapy: Secondary | ICD-10-CM | POA: Diagnosis not present

## 2023-11-29 DIAGNOSIS — K648 Other hemorrhoids: Secondary | ICD-10-CM | POA: Diagnosis not present

## 2023-11-29 DIAGNOSIS — Z9221 Personal history of antineoplastic chemotherapy: Secondary | ICD-10-CM | POA: Diagnosis not present

## 2023-11-29 DIAGNOSIS — Z9049 Acquired absence of other specified parts of digestive tract: Secondary | ICD-10-CM | POA: Diagnosis not present

## 2023-11-29 DIAGNOSIS — I2510 Atherosclerotic heart disease of native coronary artery without angina pectoris: Secondary | ICD-10-CM | POA: Insufficient documentation

## 2023-11-29 DIAGNOSIS — Z79890 Hormone replacement therapy (postmenopausal): Secondary | ICD-10-CM | POA: Diagnosis not present

## 2023-11-29 DIAGNOSIS — K641 Second degree hemorrhoids: Secondary | ICD-10-CM | POA: Diagnosis not present

## 2023-11-29 DIAGNOSIS — Z1211 Encounter for screening for malignant neoplasm of colon: Secondary | ICD-10-CM | POA: Diagnosis present

## 2023-11-29 DIAGNOSIS — Z8673 Personal history of transient ischemic attack (TIA), and cerebral infarction without residual deficits: Secondary | ICD-10-CM | POA: Insufficient documentation

## 2023-11-29 HISTORY — PX: COLONOSCOPY: SHX5424

## 2023-11-29 SURGERY — COLONOSCOPY
Anesthesia: General

## 2023-11-29 MED ORDER — PROPOFOL 10 MG/ML IV BOLUS
INTRAVENOUS | Status: DC | PRN
  Administered 2023-11-29: 80 mg via INTRAVENOUS

## 2023-11-29 MED ORDER — PROPOFOL 10 MG/ML IV BOLUS
INTRAVENOUS | Status: AC
  Filled 2023-11-29: qty 40

## 2023-11-29 MED ORDER — PROPOFOL 500 MG/50ML IV EMUL
INTRAVENOUS | Status: DC | PRN
  Administered 2023-11-29: 140 ug/kg/min via INTRAVENOUS

## 2023-11-29 MED ORDER — SODIUM CHLORIDE 0.9 % IV SOLN: INTRAVENOUS | Status: DC

## 2023-11-29 MED ORDER — LIDOCAINE HCL (CARDIAC) PF 100 MG/5ML IV SOSY
PREFILLED_SYRINGE | INTRAVENOUS | Status: DC | PRN
  Administered 2023-11-29: 60 mg via INTRAVENOUS

## 2023-11-29 NOTE — Transfer of Care (Signed)
 Immediate Anesthesia Transfer of Care Note  Patient: REGINALDO HAZARD  Procedure(s) Performed: COLONOSCOPY  Patient Location: Endoscopy Unit  Anesthesia Type:General  Level of Consciousness: drowsy  Airway & Oxygen Therapy: Patient Spontanous Breathing  Post-op Assessment: Report given to RN and Post -op Vital signs reviewed and stable  Post vital signs: Reviewed and stable  Last Vitals:  Vitals Value Taken Time  BP 100/64 11/29/23 1054  Temp 36.5 C 11/29/23 1052  Pulse 73 11/29/23 1054  Resp 17 11/29/23 1054  SpO2 99 % 11/29/23 1054  Vitals shown include unfiled device data.  Last Pain:  Vitals:   11/29/23 1052  TempSrc: Tympanic  PainSc: Asleep         Complications: No notable events documented.

## 2023-11-29 NOTE — Interval H&P Note (Signed)
 History and Physical Interval Note:  11/29/2023 10:13 AM  Ricky Hart  has presented today for surgery, with the diagnosis of jx of adenmatous polyp of colon.  The various methods of treatment have been discussed with the patient and family. After consideration of risks, benefits and other options for treatment, the patient has consented to  Procedure(s): COLONOSCOPY (N/A) as a surgical intervention.  The patient's history has been reviewed, patient examined, no change in status, stable for surgery.  I have reviewed the patient's chart and labs.  Questions were answered to the patient's satisfaction.     Shane Darling  Ok to proceed with colonoscopy

## 2023-11-29 NOTE — Anesthesia Postprocedure Evaluation (Signed)
 Anesthesia Post Note  Patient: Ricky Hart  Procedure(s) Performed: COLONOSCOPY  Patient location during evaluation: Endoscopy Anesthesia Type: General Level of consciousness: awake and alert Pain management: pain level controlled Vital Signs Assessment: post-procedure vital signs reviewed and stable Respiratory status: spontaneous breathing, nonlabored ventilation, respiratory function stable and patient connected to nasal cannula oxygen Cardiovascular status: blood pressure returned to baseline and stable Postop Assessment: no apparent nausea or vomiting Anesthetic complications: no  No notable events documented.   Last Vitals:  Vitals:   11/29/23 1102 11/29/23 1112  BP: 117/70 128/79  Pulse: 70 69  Resp: 14 15  Temp:    SpO2: 97% 98%    Last Pain:  Vitals:   11/29/23 1112  TempSrc:   PainSc: 0-No pain                 Enrique Harvest

## 2023-11-29 NOTE — Anesthesia Preprocedure Evaluation (Signed)
 Anesthesia Evaluation  Patient identified by MRN, date of birth, ID band Patient awake    Reviewed: Allergy & Precautions, NPO status , Patient's Chart, lab work & pertinent test results  Airway Mallampati: III  TM Distance: >3 FB Neck ROM: full    Dental  (+) Upper Dentures, Lower Dentures   Pulmonary former smoker   Pulmonary exam normal        Cardiovascular + CAD  Normal cardiovascular exam     Neuro/Psych CVA, No Residual Symptoms  negative psych ROS   GI/Hepatic Neg liver ROS,GERD  Medicated,,  Endo/Other  Hypothyroidism    Renal/GU      Musculoskeletal   Abdominal   Peds  Hematology negative hematology ROS (+)   Anesthesia Other Findings Past Medical History: No date: Arthritis No date: Cancer (HCC)     Comment:  head and next cancer 21yrs ago No date: Coronary artery disease No date: GERD (gastroesophageal reflux disease) No date: Hypothyroidism No date: Stroke [facility])  Past Surgical History: No date: COLONOSCOPY     Comment:  x3 No date: CORONARY ANGIOPLASTY WITH STENT PLACEMENT     Comment:  x4 yrs ago No date: portacath removal No date: RESECTION TONSIL RADICAL  BMI    Body Mass Index: 30.48 kg/m      Reproductive/Obstetrics negative OB ROS                             Anesthesia Physical Anesthesia Plan  ASA: 3  Anesthesia Plan: General   Post-op Pain Management:    Induction: Intravenous  PONV Risk Score and Plan: Propofol  infusion and TIVA  Airway Management Planned: Natural Airway and Nasal Cannula  Additional Equipment:   Intra-op Plan:   Post-operative Plan:   Informed Consent: I have reviewed the patients History and Physical, chart, labs and discussed the procedure including the risks, benefits and alternatives for the proposed anesthesia with the patient or authorized representative who has indicated his/her understanding and acceptance.      Dental Advisory Given  Plan Discussed with: Anesthesiologist, CRNA and Surgeon  Anesthesia Plan Comments: (Patient consented for risks of anesthesia including but not limited to:  - adverse reactions to medications - risk of airway placement if required - damage to eyes, teeth, lips or other oral mucosa - nerve damage due to positioning  - sore throat or hoarseness - Damage to heart, brain, nerves, lungs, other parts of body or loss of life  Patient voiced understanding.)       Anesthesia Quick Evaluation

## 2023-11-29 NOTE — H&P (Signed)
 Outpatient short stay form Pre-procedure 11/29/2023  Shane Darling, MD  Primary Physician: [facility]  Reason for visit:  Surveillance  History of present illness:    78 y/o gentleman with history of tonsillar cancer s/p chemo/radiation/surgery, hypertension, hypothyroidism, and HLD here for colonoscopy for several Ta's on colonoscopy done in 2020. Last colonoscopy in 2024 with inadequate prep. No blood thinners. No family history of GI malignancies. History of cholecystectomy.     Current Facility-Administered Medications:    0.9 %  sodium chloride  infusion, , Intravenous, Continuous, Trude Cansler, Leanora Prophet, MD, Last Rate: 20 mL/hr at 11/29/23 0845, New Bag at 11/29/23 0845  Medications Prior to Admission  Medication Sig Dispense Refill Last Dose/Taking   aspirin EC 81 MG tablet Take 81 mg by mouth daily. Swallow whole.   11/28/2023   celecoxib (CELEBREX) 100 MG capsule Take 100 mg by mouth 2 (two) times daily.   11/28/2023   diltiazem (CARDIZEM CD) 240 MG 24 hr capsule Take 240 mg by mouth daily.   11/29/2023 at  6:00 AM   atorvastatin (LIPITOR) 40 MG tablet Take 40 mg by mouth daily.   11/27/2023   fluticasone (FLONASE) 50 MCG/ACT nasal spray Place 1 spray into both nostrils daily.      levothyroxine (SYNTHROID) 50 MCG tablet Take 50 mcg by mouth daily before breakfast.   11/27/2023   methocarbamol (ROBAXIN) 500 MG tablet Take 500 mg by mouth every 8 (eight) hours as needed for muscle spasms.      pantoprazole (PROTONIX) 40 MG tablet Take 40 mg by mouth daily.   11/27/2023   sertraline (ZOLOFT) 100 MG tablet Take 100 mg by mouth daily.   11/27/2023     Not on File   Past Medical History:  Diagnosis Date   Arthritis    Cancer (HCC)    head and next cancer 56yrs ago   Coronary artery disease    GERD (gastroesophageal reflux disease)    Hypothyroidism    Stroke [facility])     Review of systems:  Otherwise negative.    Physical Exam  Gen: Alert, oriented. Appears stated age.   HEENT: PERRLA. Lungs: No respiratory distress CV: RRR Abd: soft, benign, no masses Ext: No edema    Planned procedures: Proceed with colonoscopy. The patient understands the nature of the planned procedure, indications, risks, alternatives and potential complications including but not limited to bleeding, infection, perforation, damage to internal organs and possible oversedation/side effects from anesthesia. The patient agrees and gives consent to proceed.  Please refer to procedure notes for findings, recommendations and patient disposition/instructions.     Shane Darling, MD [facility]

## 2023-11-29 NOTE — Op Note (Signed)
 [facility]astroenterology Patient Name: Ricky Hart Procedure Date: 11/29/2023 10:18 AM MRN: [id number] Account #: [id number] Date of Birth: [birth year] Admit Type: Outpatient Age: 78 Room: [facility] ENDO ROOM 3 Gender: Male Note Status: Finalized Instrument Name: Charlyn Cooley [id number] Procedure:             Colonoscopy Indications:           Surveillance: History of adenomatous polyps,                         inadequate prep on last exam (<1yr) Providers:             Leida Puna MD, MD Referring MD:          [facility] (Referring MD) Medicines:             Monitored Anesthesia Care Complications:         No immediate complications. Estimated blood loss:                         Minimal. Procedure:             Pre-Anesthesia Assessment:                        - Prior to the procedure, a History and Physical was                         performed, and patient medications and allergies were                         reviewed. The patient is competent. The risks and                         benefits of the procedure and the sedation options and                         risks were discussed with the patient. All questions                         were answered and informed consent was obtained.                         Patient identification and proposed procedure were                         verified by the physician, the nurse, the                         anesthesiologist, the anesthetist and the technician                         in the endoscopy suite. Mental Status Examination:                         alert and oriented. Airway Examination: normal                         oropharyngeal airway and neck mobility. Respiratory  Examination: clear to auscultation. CV Examination:                         normal. Prophylactic Antibiotics: The patient does not                         require prophylactic antibiotics. Prior                          Anticoagulants: The patient has taken no anticoagulant                         or antiplatelet agents. ASA Grade Assessment: III - A                         patient with severe systemic disease. After reviewing                         the risks and benefits, the patient was deemed in                         satisfactory condition to undergo the procedure. The                         anesthesia plan was to use monitored anesthesia care                         (MAC). Immediately prior to administration of                         medications, the patient was re-assessed for adequacy                         to receive sedatives. The heart rate, respiratory                         rate, oxygen saturations, blood pressure, adequacy of                         pulmonary ventilation, and response to care were                         monitored throughout the procedure. The physical                         status of the patient was re-assessed after the                         procedure.                        After obtaining informed consent, the colonoscope was                         passed under direct vision. Throughout the procedure,                         the patient's blood pressure, pulse, and oxygen  saturations were monitored continuously. The                         Colonoscope was introduced through the anus and                         advanced to the the cecum, identified by appendiceal                         orifice and ileocecal valve. The colonoscopy was                         performed without difficulty. The patient tolerated                         the procedure well. The quality of the bowel                         preparation was fair. The ileocecal valve, appendiceal                         orifice, and rectum were photographed. Findings:      The perianal and digital rectal examinations were normal.      A 3 mm polyp was found in the hepatic flexure.  The polyp was sessile.       The polyp was removed with a cold snare. Resection and retrieval were       complete. Estimated blood loss was minimal.      Internal hemorrhoids were found during retroflexion. The hemorrhoids       were Grade II (internal hemorrhoids that prolapse but reduce       spontaneously).      The exam was otherwise without abnormality on direct and retroflexion       views. Impression:            - Preparation of the colon was fair.                        - One 3 mm polyp at the hepatic flexure, removed with                         a cold snare. Resected and retrieved.                        - Internal hemorrhoids.                        - The examination was otherwise normal on direct and                         retroflexion views. Recommendation:        - Discharge patient to home.                        - Resume previous diet.                        - Continue present medications.                        -  Await pathology results.                        - Repeat colonoscopy in 2-3 years for surveillance due                         to suboptimal prep if benefits outweigh risks.                        - Return to referring physician as previously                         scheduled. Procedure Code(s):     --- Professional ---                        [phone], Colonoscopy, flexible; with removal of                         tumor(s), polyp(s), or other lesion(s) by snare                         technique Diagnosis Code(s):     --- Professional ---                        Z86.010, Personal history of colonic polyps                        K64.1, Second degree hemorrhoids                        D12.3, Benign neoplasm of transverse colon (hepatic                         flexure or splenic flexure) CPT copyright 2022 American Medical Association. All rights reserved. The codes documented in this report are preliminary and upon coder review may  be revised to meet current  compliance requirements. Leida Puna MD, MD 11/29/2023 10:57:26 AM Number of Addenda: 0 Note Initiated On: 11/29/2023 10:18 AM Scope Withdrawal Time: 0 hours 13 minutes 38 seconds  Total Procedure Duration: 0 hours 22 minutes 14 seconds  Estimated Blood Loss:  Estimated blood loss was minimal.      [facility]

## 2023-11-30 ENCOUNTER — Encounter: Payer: Self-pay | Admitting: Gastroenterology

## 2023-11-30 LAB — SURGICAL PATHOLOGY

## 2024-01-26 ENCOUNTER — Other Ambulatory Visit: Payer: Self-pay | Admitting: Internal Medicine

## 2024-01-26 DIAGNOSIS — M25519 Pain in unspecified shoulder: Secondary | ICD-10-CM

## 2024-01-31 ENCOUNTER — Telehealth: Payer: Self-pay | Admitting: Licensed Clinical Social Worker

## 2024-01-31 NOTE — Telephone Encounter (Signed)
 01/28/24 lvm requesting information about psychiatric eval for his 78yo son.

## 2024-02-03 ENCOUNTER — Ambulatory Visit
Admission: RE | Admit: 2024-02-03 | Discharge: 2024-02-03 | Disposition: A | Discharge status: Home / Self Care | Source: Ambulatory Visit | Attending: Internal Medicine | Admitting: Internal Medicine

## 2024-02-03 DIAGNOSIS — M25519 Pain in unspecified shoulder: Secondary | ICD-10-CM | POA: Diagnosis present

## 2024-02-03 MED ORDER — GADOBUTROL 1 MMOL/ML IV SOLN
7.5000 mL | Freq: Once | INTRAVENOUS | Status: AC | PRN
  Administered 2024-02-03: 7.5 mL via INTRAVENOUS

## 2024-03-17 ENCOUNTER — Other Ambulatory Visit: Payer: Self-pay | Admitting: Internal Medicine

## 2024-03-17 ENCOUNTER — Other Ambulatory Visit: Payer: Self-pay

## 2024-03-17 DIAGNOSIS — I6521 Occlusion and stenosis of right carotid artery: Secondary | ICD-10-CM

## 2024-03-17 DIAGNOSIS — R519 Headache, unspecified: Secondary | ICD-10-CM

## 2024-03-22 ENCOUNTER — Telehealth: Payer: Self-pay

## 2024-03-22 NOTE — Telephone Encounter (Signed)
-----   Message from [facility] S sent at 03/20/2024  5:07 PM EDT ----- Regarding: FW: Secure  Referral from the [facility] location [place] for Dr. Onesimo, MD for the [facility] [place]y Pedley,  Will you get this pt scheduled for [place]?  Thank you,  Hope ----- Message ----- From: Burnetta Delwin POUR Sent: 03/20/2024   4:14 PM EDT To: Lorrayne DEL Surrett Subject: Secure  Referral from the [facility] loca#  [place]  Received a [place] authorization for Mr. Worlds to see Dr Onesimo at the [facility] [place] location regarding his right shoulder please.  Auth is [id number] 03-17-24 thru 03-17-25 Thx Tisha

## 2024-03-22 NOTE — Telephone Encounter (Signed)
 Attempted to reach Ricky Hart for appt with Dr Onesimo in [place] twice. No answer and no voice mail

## 2024-03-27 ENCOUNTER — Telehealth: Payer: Self-pay | Admitting: Orthopedic Surgery

## 2024-03-27 NOTE — Telephone Encounter (Signed)
 [facility] referral [facility]

## 2024-03-28 ENCOUNTER — Ambulatory Visit
Admission: RE | Admit: 2024-03-28 | Discharge: 2024-03-28 | Disposition: A | Discharge status: Home / Self Care | Source: Ambulatory Visit | Attending: Internal Medicine | Admitting: Internal Medicine

## 2024-03-28 ENCOUNTER — Ambulatory Visit
Admission: RE | Admit: 2024-03-28 | Discharge: 2024-03-28 | Disposition: A | Discharge status: Home / Self Care | Source: Ambulatory Visit

## 2024-03-28 DIAGNOSIS — I6521 Occlusion and stenosis of right carotid artery: Secondary | ICD-10-CM | POA: Insufficient documentation

## 2024-03-28 DIAGNOSIS — R519 Headache, unspecified: Secondary | ICD-10-CM | POA: Insufficient documentation

## 2024-03-30 ENCOUNTER — Encounter: Payer: Self-pay | Admitting: Orthopedic Surgery

## 2024-03-30 ENCOUNTER — Ambulatory Visit: Admitting: Orthopedic Surgery

## 2024-03-30 ENCOUNTER — Telehealth: Payer: Self-pay

## 2024-03-30 DIAGNOSIS — M75121 Complete rotator cuff tear or rupture of right shoulder, not specified as traumatic: Secondary | ICD-10-CM | POA: Diagnosis not present

## 2024-03-30 MED ORDER — TRAMADOL HCL 50 MG PO TABS: 50.0000 mg | ORAL_TABLET | Freq: Four times a day (QID) | ORAL | 0 refills | Status: DC | PRN

## 2024-03-30 NOTE — Addendum Note (Signed)
 Addended by: VICENTA EMMIE HERO on: 03/30/2024 01:30 PM   Modules accepted: Orders

## 2024-03-30 NOTE — Progress Notes (Signed)
 New Patient Visit  Assessment: Ricky Hart is a 78 y.o. male with the following: 1. Complete tear of right rotator cuff, unspecified whether traumatic  Plan: Ricky Hart has pain in the right shoulder.  This been ongoing for several years.  It has been especially bad for the past year.  He has a massive rotator cuff tear, full-thickness tears of the supraspinatus and the infraspinatus, with retraction.  Given the constellation of symptoms, and the appearance on the MRI, I do not think that these tears are repairable.  I recommended a reverse shoulder arthroplasty.  After discussing multiple treatment options, including conservative management, he elected to proceed with surgery.  Procedure was discussed in detail.  We will obtain medical clearance through the [facility]  We will obtain a CT scan for preoperative planning  Once the steps are completed, we can schedule a date.  Risks and benefits of the surgery, including, but not limited to infection, bleeding, persistent pain, need for further surgery, poor union of the implants, stiffness, blood clots and more severe complications associated with anesthesia were discussed with the patient.  The patient has elected to proceed.  Follow-up: Return for After medical clearance for OR.  Subjective:  Chief Complaint  Patient presents with   Right shoulder pain    Progressively worsening over the past year    History of Present Illness: Ricky Hart is a 78 y.o. male who presents for evaluation of right shoulder pain.  He is right-hand dominant.  He reports he has had pain in the right shoulder for over a year.  It has been progressively worsening.  It may have been bothering him for longer.  No specific injury.  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  Medications are not providing sufficient relief.  He has had multiple injections, with very little improvement in his symptoms.  He notes radiating pains from the shoulder into the right arm.  He does have  some numbness and tingling.  He has previously had an EMG which demonstrates moderate right carpal tunnel syndrome   Review of Systems: No fevers or chills No numbness or tingling No chest pain No shortness of breath No bowel or bladder dysfunction No GI distress No headaches   Medical History:  Past Medical History:  Diagnosis Date   Arthritis    Cancer (HCC)    head and next cancer 51yrs ago   Coronary artery disease    GERD (gastroesophageal reflux disease)    Hypothyroidism    Stroke [facility])     Past Surgical History:  Procedure Laterality Date   COLONOSCOPY     x3   COLONOSCOPY N/A 11/29/2023   Procedure: COLONOSCOPY;  Surgeon: Maryruth Ole DASEN, MD;  Location: [facility] ENDOSCOPY;  Service: Endoscopy;  Laterality: N/A;   COLONOSCOPY WITH PROPOFOL  N/A 09/01/2022   Procedure: COLONOSCOPY WITH PROPOFOL ;  Surgeon: Maryruth Ole DASEN, MD;  Location: [facility] ENDOSCOPY;  Service: Endoscopy;  Laterality: N/A;   CORONARY ANGIOPLASTY WITH STENT PLACEMENT     x4 yrs ago   ESOPHAGOGASTRODUODENOSCOPY (EGD) WITH PROPOFOL  N/A 09/01/2022   Procedure: ESOPHAGOGASTRODUODENOSCOPY (EGD) WITH PROPOFOL ;  Surgeon: Maryruth Ole DASEN, MD;  Location: [facility] ENDOSCOPY;  Service: Endoscopy;  Laterality: N/A;   portacath removal     RESECTION TONSIL RADICAL      No family history on file. Social History   Tobacco Use   Smoking status: Former    Types: Cigarettes   Smokeless tobacco: Never  Vaping Use   Vaping status: Never  Used  Substance Use Topics   Alcohol use: Not Currently   Drug use: Never    Not on File  Current Meds  Medication Sig   aspirin EC 81 MG tablet Take 81 mg by mouth daily. Swallow whole.   atorvastatin (LIPITOR) 40 MG tablet Take 40 mg by mouth daily.   celecoxib (CELEBREX) 100 MG capsule Take 100 mg by mouth 2 (two) times daily.   diltiazem (CARDIZEM CD) 240 MG 24 hr capsule Take 240 mg by mouth daily.   fluticasone (FLONASE) 50 MCG/ACT nasal spray Place 1 spray into  both nostrils daily.   levothyroxine (SYNTHROID) 50 MCG tablet Take 50 mcg by mouth daily before breakfast.   methocarbamol (ROBAXIN) 500 MG tablet Take 500 mg by mouth every 8 (eight) hours as needed for muscle spasms.   pantoprazole (PROTONIX) 40 MG tablet Take 40 mg by mouth daily.   sertraline (ZOLOFT) 100 MG tablet Take 100 mg by mouth daily.   traMADol (ULTRAM) 50 MG tablet Take 1 tablet (50 mg total) by mouth every 6 (six) hours as needed.    Objective: There were no vitals taken for this visit.  Physical Exam:  General: Alert and oriented. and No acute distress. Gait: Normal gait.  Right shoulder without deformity.  160 degrees of forward flexion with obvious discomfort.  Internal rotation of the lumbar spine.  Slightly restricted external rotation.  Positive Jobes.  4/5 supraspinatus and infraspinatus strength.  Negative belly press.  IMAGING: I personally reviewed images previously obtained in clinic  Right shoulder MRI  IMPRESSION: Large retracted full-thickness tears of the supraspinatus and infraspinatus tendons. No significant muscle belly atrophy. 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   Small interstitial tear to the cranial subscapularis insertion.   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   Small nondisplaced degenerative tear to the superior/posterior labrum.  New Medications:  Meds ordered this encounter  Medications   traMADol (ULTRAM) 50 MG tablet    Sig: Take 1 tablet (50 mg total) by mouth every 6 (six) hours as needed.    Dispense:  30 tablet    Refill:  0      [name], MD  03/30/2024 10:48 AM

## 2024-03-30 NOTE — Patient Instructions (Addendum)
 Plan for reverse shoulder arthroplasty of the right shoulder  Obtain medical clearance for surgery  CT scan for preoperative planning

## 2024-03-30 NOTE — Telephone Encounter (Signed)
 Walterine Alter Dr Onesimo needs clearance on this patient and a CT scan right shoulder. He said the CT scan has to be ordered a certain way for surgery. Can you help me with this? I also dont know who does schedules his surgery for the clearance purpose.

## 2024-03-30 NOTE — Telephone Encounter (Signed)
 Orders placed, and letter sent. All general information questions answered in alternate message.

## 2024-04-12 ENCOUNTER — Ambulatory Visit
Admission: RE | Admit: 2024-04-12 | Discharge: 2024-04-12 | Disposition: A | Discharge status: Home / Self Care | Source: Ambulatory Visit | Attending: Orthopedic Surgery | Admitting: Orthopedic Surgery

## 2024-04-12 DIAGNOSIS — M75121 Complete rotator cuff tear or rupture of right shoulder, not specified as traumatic: Secondary | ICD-10-CM | POA: Insufficient documentation

## 2024-04-14 ENCOUNTER — Telehealth: Payer: Self-pay | Admitting: Orthopedic Surgery

## 2024-04-14 MED ORDER — TRAMADOL HCL 50 MG PO TABS: 50.0000 mg | ORAL_TABLET | Freq: Four times a day (QID) | ORAL | 0 refills | Status: DC | PRN

## 2024-04-14 NOTE — Telephone Encounter (Signed)
 Pt called stating that they want their tramadol 50 mg refilled. Pharmacy is [facility] on [address]. Pt would like call back when filled. Call back number is [phone]

## 2024-04-14 NOTE — Telephone Encounter (Signed)
**Note De-identified  [place] Obfuscation** Please advise 

## 2024-04-24 ENCOUNTER — Encounter: Payer: Self-pay | Admitting: Radiology

## 2024-04-25 ENCOUNTER — Telehealth: Payer: Self-pay

## 2024-04-25 NOTE — Telephone Encounter (Signed)
 Patient called wanting to know when his surgery is going to be scheduled.  Please call and advise [phone]

## 2024-04-26 ENCOUNTER — Telehealth: Payer: Self-pay | Admitting: Orthopedic Surgery

## 2024-04-26 NOTE — Telephone Encounter (Signed)
 Dr. Onesimo pt - pt called this morning, lvm for Leta.  He wants to speak to her regarding his upcoming surgery.

## 2024-04-26 NOTE — Telephone Encounter (Signed)
 Called pt and let pt know we are waiting from [place] clearance to get him scheduled. Asked [facility] to call [facility] to check the [facility].

## 2024-04-26 NOTE — Telephone Encounter (Signed)
 Patient stopped by the office to try and follow up on a [facility] referral to [facility]. Onesimo. He states he spoke with the [facility] earlier and was advised the referral was faxed several times over the last few days to [phone].

## 2024-04-27 NOTE — Telephone Encounter (Signed)
 Referral received. Routed to Cassandra, Tisha and Wendy.

## 2024-04-27 NOTE — Telephone Encounter (Signed)
 The information is not the clearance letter that was sent to the [facility], it's the appointment he received the referral for shoulder specialist. I have sent it to Dr. Onesimo for review to see if it's acceptable. I had called Ricky Hart yesterday to let him know we needed the clearance letter, so he has been contacted by the office and updated on what's needed.

## 2024-04-27 NOTE — Telephone Encounter (Signed)
 Nothing received as of yesterday at 4:30. I am about to start the faxes now.

## 2024-04-28 ENCOUNTER — Other Ambulatory Visit: Payer: Self-pay

## 2024-04-28 ENCOUNTER — Ambulatory Visit: Payer: Self-pay | Admitting: Orthopedic Surgery

## 2024-04-28 DIAGNOSIS — M75121 Complete rotator cuff tear or rupture of right shoulder, not specified as traumatic: Secondary | ICD-10-CM

## 2024-05-03 NOTE — Patient Instructions (Addendum)
 Your procedure is scheduled on:05-15-24 Monday Report to the Registration Desk on the 1st floor of the [facility].Then proceed to the 2nd floor Surgery Desk To find out your arrival time, please call [phone] between 1PM - 3PM on:05-12-24 Friday If your arrival time is 6:00 am, do not arrive before that time as the [facility] entrance doors do not open until 6:00 am.  REMEMBER: Instructions that are not followed completely may result in serious medical risk, up to and including death; or upon the discretion of your surgeon and anesthesiologist your surgery may need to be rescheduled.  Do not eat food after midnight the night before surgery.  No gum chewing or hard candies.  You may however, drink CLEAR liquids up to 2 hours before you are scheduled to arrive for your surgery. Do not drink anything within 2 hours of your scheduled arrival time.  Clear liquids include: - water  - apple juice without pulp - gatorade (not RED colors) - black coffee or tea (Do NOT add milk or creamers to the coffee or tea) Do NOT drink anything that is not on this list.  In addition, your doctor has ordered for you to drink the provided:  Ensure Pre-Surgery Clear Carbohydrate Drink  Drinking this carbohydrate drink up to two hours before surgery helps to reduce insulin resistance and improve patient outcomes. Please complete drinking 2 hours before scheduled arrival time.  One week prior to surgery:Last dose will be on 05-07-24 Sunday Stop Anti-inflammatories (NSAIDS) such as Advil, Aleve, Ibuprofen, Motrin, Naproxen, Naprosyn and Aspirin based products such as Excedrin, Goody's Powder, BC Powder, Pepto Bismol. You may continue your celecoxib (CELEBREX) up until the day prior to surgery Stop ANY OVER THE COUNTER supplements until after surgery (Vitamin B12, Vitamin D3, Melatonin, Multivitamin)  You may however, continue to take Tylenol/Tramadol if needed for pain up until the day of  surgery.  Continue taking all of your other prescription medications up until the day of surgery.  ON THE DAY OF SURGERY ONLY TAKE THESE MEDICATIONS WITH SIPS OF WATER: -diltiazem (CARDIZEM CD)  -levothyroxine (SYNTHROID)  -omeprazole (PRILOSEC)   Continue your 81 mg Aspirin up until the day prior to surgery-Do NOT take the day of surgery  No Alcohol for 24 hours before or after surgery.  No Smoking including e-cigarettes for 24 hours before surgery.  No chewable tobacco products for at least 6 hours before surgery.  No nicotine patches on the day of surgery.  Do not use any recreational drugs for at least a week (preferably 2 weeks) before your surgery.  Please be advised that the combination of cocaine and anesthesia may have negative outcomes, up to and including death. If you test positive for cocaine, your surgery will be cancelled.  On the morning of surgery brush your teeth with toothpaste and water, you may rinse your mouth with mouthwash if you wish. Do not swallow any toothpaste or mouthwash.  Use CHG Soap as directed on instruction sheet.  Do not wear jewelry, make-up, hairpins, clips or nail polish.  For welded (permanent) jewelry: bracelets, anklets, waist bands, etc.  Please have this removed prior to surgery.  If it is not removed, there is a chance that hospital personnel will need to cut it off on the day of surgery.  Do not wear lotions, powders, or perfumes.   Do not shave body hair from the neck down 48 hours before surgery.  Contact lenses, hearing aids and dentures may not be worn  into surgery.  Do not bring valuables to the hospital. [facility] is not responsible for any missing/lost belongings or valuables.   Total Shoulder Arthroplasty:  use Benzoyl Peroxide 5% Gel as directed on instruction sheet.  Notify your doctor if there is any change in your medical condition (cold, fever, infection).  Wear comfortable clothing (specific to your surgery  type) to the hospital.  After surgery, you can help prevent lung complications by doing breathing exercises.  Take deep breaths and cough every 1-2 hours. Your doctor may order a device called an Incentive Spirometer to help you take deep breaths. When coughing or sneezing, hold a pillow firmly against your incision with both hands. This is called "splinting." Doing this helps protect your incision. It also decreases belly discomfort.  If you are being admitted to the hospital overnight, leave your suitcase in the car. After surgery it may be brought to your room.  In case of increased patient census, it may be necessary for you, the patient, to continue your postoperative care in the Same Day Surgery department.  If you are being discharged the day of surgery, you will not be allowed to drive home. You will need a responsible individual to drive you home and stay with you for 24 hours after surgery.   If you are taking public transportation, you will need to have a responsible individual with you.  Please call the Pre-admissions Testing Dept. at [phone] if you have any questions about these instructions.  Surgery Visitation Policy:  Patients having surgery or a procedure may have two visitors.  Children under the age of 95 must have an adult with them who is not the patient.  Inpatient Visitation:    Visiting hours are 7 a.m. to 8 p.m. Up to four visitors are allowed at one time in a patient room. The visitors may rotate out with other people during the day.  One visitor age 49 or older may stay with the patient overnight and must be in the room by 8 p.m.    Pre-operative 4 CHG Bath Instructions   You can play a key role in reducing the risk of infection after surgery. Your skin needs to be as free of germs as possible. You can reduce the number of germs on your skin by washing with CHG (chlorhexidine gluconate) soap before surgery. CHG is an antiseptic soap that kills germs  and continues to kill germs even after washing.   DO NOT use if you have an allergy to chlorhexidine/CHG or antibacterial soaps. If your skin becomes reddened or irritated, stop using the CHG and notify one of our RNs at [phone].   Please shower with the CHG soap starting 4 days before surgery using the following schedule:     Please keep in mind the following:  DO NOT shave, including legs and underarms, starting the day of your first shower.   You may shave your face at any point before/day of surgery.  Place clean sheets on your bed the day you start using CHG soap. Use a clean washcloth (not used since being washed) for each shower. DO NOT sleep with pets once you start using the CHG.   CHG Shower Instructions:  If you choose to wash your hair and private area, wash first with your normal shampoo/soap.  After you use shampoo/soap, rinse your hair and body thoroughly to remove shampoo/soap residue.  Turn the water OFF and apply about 3 tablespoons (45 ml) of CHG soap to  a Animal Nutritionist.  Apply CHG soap ONLY FROM YOUR NECK DOWN TO YOUR TOES (washing for 3-5 minutes)  DO NOT use CHG soap on face, private areas, open wounds, or sores.  Pay special attention to the area where your surgery is being performed.  If you are having back surgery, having someone wash your back for you may be helpful. Wait 2 minutes after CHG soap is applied, then you may rinse off the CHG soap.  Pat dry with a clean towel  Put on clean clothes/pajamas   If you choose to wear lotion, please use ONLY the CHG-compatible lotions on the back of this paper.     Additional instructions for the day of surgery: DO NOT APPLY any lotions, deodorants, cologne, or perfumes.   Put on clean/comfortable clothes.  Brush your teeth.  Ask your nurse before applying any prescription medications to the skin.      CHG Compatible Lotions   Aveeno Moisturizing lotion  Cetaphil Moisturizing Cream  Cetaphil  Moisturizing Lotion  Clairol Herbal Essence Moisturizing Lotion, Dry Skin  Clairol Herbal Essence Moisturizing Lotion, Extra Dry Skin  Clairol Herbal Essence Moisturizing Lotion, Normal Skin  Curel Age Defying Therapeutic Moisturizing Lotion with Alpha Hydroxy  Curel Extreme Care Body Lotion  Curel Soothing Hands Moisturizing Hand Lotion  Curel Therapeutic Moisturizing Cream, Fragrance-Free  Curel Therapeutic Moisturizing Lotion, Fragrance-Free  Curel Therapeutic Moisturizing Lotion, Original Formula  Eucerin Daily Replenishing Lotion  Eucerin Dry Skin Therapy Plus Alpha Hydroxy Crme  Eucerin Dry Skin Therapy Plus Alpha Hydroxy Lotion  Eucerin Original Crme  Eucerin Original Lotion  Eucerin Plus Crme Eucerin Plus Lotion  Eucerin TriLipid Replenishing Lotion  Keri Anti-Bacterial Hand Lotion  Keri Deep Conditioning Original Lotion Dry Skin Formula Softly Scented  Keri Deep Conditioning Original Lotion, Fragrance Free Sensitive Skin Formula  Keri Lotion Fast Absorbing Fragrance Free Sensitive Skin Formula  Keri Lotion Fast Absorbing Softly Scented Dry Skin Formula  Keri Original Lotion  Keri Skin Renewal Lotion Keri Silky Smooth Lotion  Keri Silky Smooth Sensitive Skin Lotion  Nivea Body Creamy Conditioning Oil  Nivea Body Extra Enriched Lotion  Nivea Body Original Lotion  Nivea Body Sheer Moisturizing Lotion Nivea Crme  Nivea Skin Firming Lotion  NutraDerm 30 Skin Lotion  NutraDerm Skin Lotion  NutraDerm Therapeutic Skin Cream  NutraDerm Therapeutic Skin Lotion  ProShield Protective Hand Cream   Preparing for Total Shoulder Arthroplasty  Before surgery, you can play an important role by reducing the number of germs on your skin by using the following products:  Benzoyl Peroxide Gel  o Reduces the number of germs present on the skin  o Applied twice a day to shoulder area starting two days before surgery  Chlorhexidine Gluconate (CHG) Soap  o An antiseptic cleaner  that kills germs and bonds with the skin to continue killing germs even after washing  o Used for showering the night before surgery and morning of surgery  BENZOYL PEROXIDE 5% GEL                               Please do not use if you have an allergy to benzoyl peroxide. If your skin becomes reddened/irritated stop using the benzoyl peroxide.  Starting two days before surgery, apply as follows:  1. Apply benzoyl peroxide in the morning and at night. Apply after taking a shower. If you are not taking a shower, clean entire shoulder front, back, and side  along with the armpit with a clean wet washcloth.  2. Place a quarter-sized dollop on your shoulder and rub in thoroughly, making sure to cover the front, back, and side of your shoulder, along with the armpit.  2 days before ____ AM ____ PM 1 day before ____ AM ____ PM  3. Do this twice a day for two days. (Last application is the night before surgery, AFTER using the CHG soap).  4. Do NOT apply benzoyl peroxide gel on the day of surgery.    CHG Compatible Lotions   Aveeno Moisturizing lotion  Cetaphil Moisturizing Cream  Cetaphil Moisturizing Lotion  Clairol Herbal Essence Moisturizing Lotion, Dry Skin  Clairol Herbal Essence Moisturizing Lotion, Extra Dry Skin  Clairol Herbal Essence Moisturizing Lotion, Normal Skin  Curel Age Defying Therapeutic Moisturizing Lotion with Alpha Hydroxy  Curel Extreme Care Body Lotion  Curel Soothing Hands Moisturizing Hand Lotion  Curel Therapeutic Moisturizing Cream, Fragrance-Free  Curel Therapeutic Moisturizing Lotion, Fragrance-Free  Curel Therapeutic Moisturizing Lotion, Original Formula  Eucerin Daily Replenishing Lotion  Eucerin Dry Skin Therapy Plus Alpha Hydroxy Crme  Eucerin Dry Skin Therapy Plus Alpha Hydroxy Lotion  Eucerin Original Crme  Eucerin Original Lotion  Eucerin Plus Crme Eucerin Plus Lotion  Eucerin TriLipid Replenishing Lotion  Keri Anti-Bacterial Hand Lotion   Keri Deep Conditioning Original Lotion Dry Skin Formula Softly Scented  Keri Deep Conditioning Original Lotion, Fragrance Free Sensitive Skin Formula  Keri Lotion Fast Absorbing Fragrance Free Sensitive Skin Formula  Keri Lotion Fast Absorbing Softly Scented Dry Skin Formula  Keri Original Lotion  Keri Skin Renewal Lotion Keri Silky Smooth Lotion  Keri Silky Smooth Sensitive Skin Lotion  Nivea Body Creamy Conditioning Oil  Nivea Body Extra Enriched Lotion  Nivea Body Original Lotion  Nivea Body Sheer Moisturizing Lotion Nivea Crme  Nivea Skin Firming Lotion  NutraDerm 30 Skin Lotion  NutraDerm Skin Lotion  NutraDerm Therapeutic Skin Cream  NutraDerm Therapeutic Skin Lotion  ProShield Protective Hand Cream  Provon moisturizing lotion  How to Use an Incentive Spirometer An incentive spirometer is a tool that measures how well you are filling your lungs with each breath. Learning to take long, deep breaths using this tool can help you keep your lungs clear and active. This may help to reverse or lessen your chance of developing breathing (pulmonary) problems, especially infection. You may be asked to use a spirometer: After a surgery. If you have a lung problem or a history of smoking. After a long period of time when you have been unable to move or be active. If the spirometer includes an indicator to show the highest number that you have reached, your health care provider or respiratory therapist will help you set a goal. Keep a log of your progress as told by your health care provider. What are the risks? Breathing too quickly may cause dizziness or cause you to pass out. Take your time so you do not get dizzy or light-headed. If you are in pain, you may need to take pain medicine before doing incentive spirometry. It is harder to take a deep breath if you are having pain. How to use your incentive spirometer  Sit up on the edge of your bed or on a chair. Hold the incentive spirometer  so that it is in an upright position. Before you use the spirometer, breathe out normally. Place the mouthpiece in your mouth. Make sure your lips are closed tightly around it. Breathe in slowly and as deeply as you  can through your mouth, causing the piston or the ball to rise toward the top of the chamber. Hold your breath for 3-5 seconds, or for as long as possible. If the spirometer includes a coach indicator, use this to guide you in breathing. Slow down your breathing if the indicator goes above the marked areas. Remove the mouthpiece from your mouth and breathe out normally. The piston or ball will return to the bottom of the chamber. Rest for a few seconds, then repeat the steps 10 or more times. Take your time and take a few normal breaths between deep breaths so that you do not get dizzy or light-headed. Do this every 1-2 hours when you are awake. If the spirometer includes a goal marker to show the highest number you have reached (best effort), use this as a goal to work toward during each repetition. After each set of 10 deep breaths, cough a few times. This will help to make sure that your lungs are clear. If you have an incision on your chest or abdomen from surgery, place a pillow or a rolled-up towel firmly against the incision when you cough. This can help to reduce pain while taking deep breaths and coughing. General tips When you are able to get out of bed: Walk around often. Continue to take deep breaths and cough in order to clear your lungs. Keep using the incentive spirometer until your health care provider says it is okay to stop using it. If you have been in the hospital, you may be told to keep using the spirometer at home. Contact a health care provider if: You are having difficulty using the spirometer. You have trouble using the spirometer as often as instructed. Your pain medicine is not giving enough relief for you to use the spirometer as told. You have a  fever. Get help right away if: You develop shortness of breath. You develop a cough with bloody mucus from the lungs. You have fluid or blood coming from an incision site after you cough. Summary An incentive spirometer is a tool that can help you learn to take long, deep breaths to keep your lungs clear and active. You may be asked to use a spirometer after a surgery, if you have a lung problem or a history of smoking, or if you have been inactive for a long period of time. Use your incentive spirometer as instructed every 1-2 hours while you are awake. If you have an incision on your chest or abdomen, place a pillow or a rolled-up towel firmly against your incision when you cough. This will help to reduce pain. Get help right away if you have shortness of breath, you cough up bloody mucus, or blood comes from your incision when you cough. This information is not intended to replace advice given to you by your health care provider. Make sure you discuss any questions you have with your health care provider. Document Revised: 04/16/2023 Document Reviewed: 04/16/2023 Elsevier Patient Education  2024 [employer]vier Inc.     Preoperative Educational Videos for Total Hip, Knee and Shoulder Replacements  To better prepare for surgery, please view our videos that explain the physical activity and discharge planning required to have the best surgical recovery at [facility].  [email or web address]  Questions? Call [phone] or email [email or web address]        Community Resource Directory to address health-related social needs:  [email or web address]

## 2024-05-04 ENCOUNTER — Encounter
Admission: RE | Admit: 2024-05-04 | Discharge: 2024-05-04 | Disposition: A | Discharge status: Home / Self Care | Source: Ambulatory Visit | Attending: Orthopedic Surgery | Admitting: Orthopedic Surgery

## 2024-05-04 ENCOUNTER — Other Ambulatory Visit: Payer: Self-pay

## 2024-05-04 VITALS — BP 119/71 | HR 65 | Resp 14 | Ht 66.0 in | Wt 192.7 lb

## 2024-05-04 DIAGNOSIS — I25119 Atherosclerotic heart disease of native coronary artery with unspecified angina pectoris: Secondary | ICD-10-CM | POA: Diagnosis not present

## 2024-05-04 DIAGNOSIS — Z01818 Encounter for other preprocedural examination: Secondary | ICD-10-CM | POA: Diagnosis present

## 2024-05-04 DIAGNOSIS — M12811 Other specific arthropathies, not elsewhere classified, right shoulder: Secondary | ICD-10-CM

## 2024-05-04 DIAGNOSIS — I10 Essential (primary) hypertension: Secondary | ICD-10-CM | POA: Insufficient documentation

## 2024-05-04 DIAGNOSIS — Z01812 Encounter for preprocedural laboratory examination: Secondary | ICD-10-CM

## 2024-05-04 DIAGNOSIS — Z01810 Encounter for preprocedural cardiovascular examination: Secondary | ICD-10-CM

## 2024-05-04 HISTORY — DX: Unspecified rotator cuff tear or rupture of right shoulder, not specified as traumatic: M75.101

## 2024-05-04 HISTORY — DX: Post-traumatic stress disorder, unspecified: F43.10

## 2024-05-04 HISTORY — DX: Malignant neoplasm of tonsil, unspecified: C09.9

## 2024-05-04 HISTORY — DX: Venous insufficiency (chronic) (peripheral): I87.2

## 2024-05-04 HISTORY — DX: Essential (primary) hypertension: I10

## 2024-05-04 HISTORY — DX: Hemangioma unspecified site: D18.00

## 2024-05-04 HISTORY — DX: Prediabetes: R73.03

## 2024-05-04 HISTORY — DX: Complete loss of teeth, unspecified cause, unspecified class: K08.109

## 2024-05-04 HISTORY — DX: Presence of external hearing-aid: Z97.4

## 2024-05-04 HISTORY — DX: Occlusion and stenosis of unspecified carotid artery: I65.29

## 2024-05-04 HISTORY — DX: Radiculopathy, cervical region: M54.12

## 2024-05-04 LAB — BASIC METABOLIC PANEL WITH GFR
Anion gap: 9 (ref 5–15)
BUN: 22 mg/dL (ref 8–23)
CO2: 29 mmol/L (ref 22–32)
Calcium: 9.3 mg/dL (ref 8.9–10.3)
Chloride: 103 mmol/L (ref 98–111)
Creatinine, Ser: 0.91 mg/dL (ref 0.61–1.24)
GFR, Estimated: >60 mL/min (ref >60)
Glucose, Bld: 106 mg/dL — ABNORMAL HIGH (ref 70–99)
Potassium: 4.1 mmol/L (ref 3.5–5.1)
Sodium: 142 mmol/L (ref 135–145)

## 2024-05-04 LAB — CBC
HCT: 40.2 % (ref 39.0–52.0)
Hemoglobin: 13.2 g/dL (ref 13.0–17.0)
MCH: 30.2 pg (ref 26.0–34.0)
MCHC: 32.8 g/dL (ref 30.0–36.0)
MCV: 92 fL (ref 80.0–100.0)
Platelets: 228 K/uL (ref 150–400)
RBC: 4.37 MIL/uL (ref 4.22–5.81)
RDW: 11.9 % (ref 11.5–15.5)
WBC: 5.4 K/uL (ref 4.0–10.5)
nRBC: 0 % (ref 0.0–0.2)

## 2024-05-04 LAB — SURGICAL PCR SCREEN
MRSA, PCR: NEGATIVE
Staphylococcus aureus: POSITIVE — AB

## 2024-05-09 ENCOUNTER — Encounter: Payer: Self-pay | Admitting: Internal Medicine

## 2024-05-14 MED ORDER — TRANEXAMIC ACID-NACL 1000-0.7 MG/100ML-% IV SOLN
1000.0000 mg | INTRAVENOUS | Status: AC
  Administered 2024-05-15: 1000 mg via INTRAVENOUS

## 2024-05-14 MED ORDER — LACTATED RINGERS IV SOLN: INTRAVENOUS | Status: DC

## 2024-05-14 MED ORDER — CEFAZOLIN SODIUM-DEXTROSE 2-4 GM/100ML-% IV SOLN
2.0000 g | INTRAVENOUS | Status: AC
  Administered 2024-05-15: 2 g via INTRAVENOUS

## 2024-05-14 MED ORDER — CHLORHEXIDINE GLUCONATE 0.12 % MT SOLN
15.0000 mL | Freq: Once | OROMUCOSAL | Status: AC
  Administered 2024-05-15: 15 mL via OROMUCOSAL

## 2024-05-14 MED ORDER — ORAL CARE MOUTH RINSE: 15.0000 mL | Freq: Once | OROMUCOSAL | Status: AC

## 2024-05-15 ENCOUNTER — Encounter
Admission: RE | Disposition: A | Discharge status: Home / Self Care | Payer: Self-pay | Source: Home / Self Care | Attending: Orthopedic Surgery

## 2024-05-15 ENCOUNTER — Ambulatory Visit

## 2024-05-15 ENCOUNTER — Observation Stay

## 2024-05-15 ENCOUNTER — Ambulatory Visit: Payer: Self-pay | Admitting: Urgent Care

## 2024-05-15 ENCOUNTER — Other Ambulatory Visit: Payer: Self-pay

## 2024-05-15 ENCOUNTER — Observation Stay
Admission: RE | Admit: 2024-05-15 | Discharge: 2024-05-16 | Disposition: A | Discharge status: Home / Self Care | Attending: Orthopedic Surgery | Admitting: Orthopedic Surgery

## 2024-05-15 ENCOUNTER — Encounter: Payer: Self-pay | Admitting: Orthopedic Surgery

## 2024-05-15 ENCOUNTER — Ambulatory Visit: Admitting: Anesthesiology

## 2024-05-15 DIAGNOSIS — Z7982 Long term (current) use of aspirin: Secondary | ICD-10-CM | POA: Insufficient documentation

## 2024-05-15 DIAGNOSIS — M75121 Complete rotator cuff tear or rupture of right shoulder, not specified as traumatic: Principal | ICD-10-CM | POA: Insufficient documentation

## 2024-05-15 DIAGNOSIS — I10 Essential (primary) hypertension: Secondary | ICD-10-CM | POA: Diagnosis not present

## 2024-05-15 DIAGNOSIS — I2510 Atherosclerotic heart disease of native coronary artery without angina pectoris: Secondary | ICD-10-CM | POA: Diagnosis not present

## 2024-05-15 DIAGNOSIS — Z8673 Personal history of transient ischemic attack (TIA), and cerebral infarction without residual deficits: Secondary | ICD-10-CM | POA: Diagnosis not present

## 2024-05-15 DIAGNOSIS — Z955 Presence of coronary angioplasty implant and graft: Secondary | ICD-10-CM | POA: Diagnosis not present

## 2024-05-15 DIAGNOSIS — M25511 Pain in right shoulder: Secondary | ICD-10-CM | POA: Diagnosis present

## 2024-05-15 DIAGNOSIS — Z85818 Personal history of malignant neoplasm of other sites of lip, oral cavity, and pharynx: Secondary | ICD-10-CM | POA: Diagnosis not present

## 2024-05-15 DIAGNOSIS — Z87891 Personal history of nicotine dependence: Secondary | ICD-10-CM | POA: Diagnosis not present

## 2024-05-15 DIAGNOSIS — E039 Hypothyroidism, unspecified: Secondary | ICD-10-CM | POA: Diagnosis not present

## 2024-05-15 DIAGNOSIS — M75101 Unspecified rotator cuff tear or rupture of right shoulder, not specified as traumatic: Principal | ICD-10-CM | POA: Diagnosis present

## 2024-05-15 HISTORY — PX: REVERSE SHOULDER ARTHROPLASTY: SHX5054

## 2024-05-15 LAB — HEMOGLOBIN AND HEMATOCRIT, BLOOD
HCT: 35.1 % — ABNORMAL LOW (ref 39.0–52.0)
Hemoglobin: 11.5 g/dL — ABNORMAL LOW (ref 13.0–17.0)

## 2024-05-15 SURGERY — ARTHROPLASTY, SHOULDER, TOTAL, REVERSE
Anesthesia: General | Site: Shoulder | Laterality: Right

## 2024-05-15 MED ORDER — PHENYLEPHRINE HCL-NACL 20-0.9 MG/250ML-% IV SOLN
INTRAVENOUS | Status: DC | PRN
  Administered 2024-05-15: 10 ug/min via INTRAVENOUS

## 2024-05-15 MED ORDER — DEXAMETHASONE SOD PHOSPHATE PF 10 MG/ML IJ SOLN
INTRAMUSCULAR | Status: DC | PRN
  Administered 2024-05-15: 5 mg via INTRAVENOUS

## 2024-05-15 MED ORDER — ONDANSETRON HCL 4 MG/2ML IJ SOLN: 4.0000 mg | Freq: Four times a day (QID) | INTRAMUSCULAR | Status: DC | PRN

## 2024-05-15 MED ORDER — ACETAMINOPHEN 500 MG PO TABS
1000.0000 mg | ORAL_TABLET | Freq: Three times a day (TID) | ORAL | Status: DC
Start: 2024-05-15 — End: 2024-05-17
  Administered 2024-05-15 – 2024-05-16 (×2): 1000 mg via ORAL
  Filled 2024-05-15 (×2): qty 2

## 2024-05-15 MED ORDER — ONDANSETRON HCL 4 MG/2ML IJ SOLN
INTRAMUSCULAR | Status: DC | PRN
  Administered 2024-05-15: 4 mg via INTRAVENOUS

## 2024-05-15 MED ORDER — FENTANYL CITRATE (PF) 100 MCG/2ML IJ SOLN
INTRAMUSCULAR | Status: DC | PRN
  Administered 2024-05-15: 50 ug via INTRAVENOUS

## 2024-05-15 MED ORDER — BUPIVACAINE-EPINEPHRINE (PF) 0.5% -1:200000 IJ SOLN
INTRAMUSCULAR | Status: AC
Start: 2024-05-15 — End: 2024-05-15
  Filled 2024-05-15: qty 30

## 2024-05-15 MED ORDER — DROPERIDOL 2.5 MG/ML IJ SOLN: 0.6250 mg | Freq: Once | INTRAMUSCULAR | Status: DC | PRN

## 2024-05-15 MED ORDER — LORATADINE 10 MG PO TABS
10.0000 mg | ORAL_TABLET | Freq: Every day | ORAL | Status: DC
  Filled 2024-05-15: qty 1

## 2024-05-15 MED ORDER — OXYCODONE HCL 5 MG PO TABS
5.0000 mg | ORAL_TABLET | ORAL | Status: DC | PRN
  Administered 2024-05-15 – 2024-05-16 (×3): 5 mg via ORAL
  Filled 2024-05-15: qty 1

## 2024-05-15 MED ORDER — FENTANYL CITRATE (PF) 100 MCG/2ML IJ SOLN
INTRAMUSCULAR | Status: AC
  Filled 2024-05-15: qty 2

## 2024-05-15 MED ORDER — GLYCOPYRROLATE 0.2 MG/ML IJ SOLN
INTRAMUSCULAR | Status: DC | PRN
  Administered 2024-05-15: .2 mg via INTRAVENOUS

## 2024-05-15 MED ORDER — DULOXETINE HCL 60 MG PO CPEP
90.0000 mg | ORAL_CAPSULE | Freq: Every day | ORAL | Status: DC
  Administered 2024-05-16: 90 mg via ORAL
  Filled 2024-05-15: qty 1

## 2024-05-15 MED ORDER — MIDAZOLAM HCL 2 MG/2ML IJ SOLN
INTRAMUSCULAR | Status: AC
  Filled 2024-05-15: qty 2

## 2024-05-15 MED ORDER — BUPIVACAINE HCL (PF) 0.5 % IJ SOLN
INTRAMUSCULAR | Status: DC | PRN
  Administered 2024-05-15: 10 mL via PERINEURAL

## 2024-05-15 MED ORDER — PROPOFOL 10 MG/ML IV BOLUS
INTRAVENOUS | Status: AC
  Filled 2024-05-15: qty 40

## 2024-05-15 MED ORDER — VANCOMYCIN HCL 1000 MG IV SOLR
INTRAVENOUS | Status: DC | PRN
Start: 2024-05-15 — End: 2024-05-15
  Administered 2024-05-15: 1000 mg via TOPICAL

## 2024-05-15 MED ORDER — ACETAMINOPHEN 10 MG/ML IV SOLN
INTRAVENOUS | Status: DC | PRN
  Administered 2024-05-15: 1000 mg via INTRAVENOUS

## 2024-05-15 MED ORDER — PHENYLEPHRINE 80 MCG/ML (10ML) SYRINGE FOR IV PUSH (FOR BLOOD PRESSURE SUPPORT)
PREFILLED_SYRINGE | INTRAVENOUS | Status: AC
  Filled 2024-05-15: qty 10

## 2024-05-15 MED ORDER — PHENYLEPHRINE HCL-NACL 20-0.9 MG/250ML-% IV SOLN
INTRAVENOUS | Status: AC
Start: 2024-05-15 — End: 2024-05-15
  Filled 2024-05-15: qty 250

## 2024-05-15 MED ORDER — ACETAMINOPHEN 10 MG/ML IV SOLN
INTRAVENOUS | Status: AC
  Filled 2024-05-15: qty 100

## 2024-05-15 MED ORDER — ONDANSETRON HCL 4 MG PO TABS: 4.0000 mg | ORAL_TABLET | Freq: Four times a day (QID) | ORAL | Status: DC | PRN

## 2024-05-15 MED ORDER — STERILE WATER FOR IRRIGATION IR SOLN
Status: DC | PRN
  Administered 2024-05-15: 1000 mL

## 2024-05-15 MED ORDER — PROPOFOL 10 MG/ML IV BOLUS
INTRAVENOUS | Status: DC | PRN
  Administered 2024-05-15: 150 mg via INTRAVENOUS

## 2024-05-15 MED ORDER — GUAIFENESIN ER 600 MG PO TB12: 600.0000 mg | ORAL_TABLET | Freq: Two times a day (BID) | ORAL | Status: DC | PRN

## 2024-05-15 MED ORDER — BUPIVACAINE LIPOSOME 1.3 % IJ SUSP
INTRAMUSCULAR | Status: AC
  Filled 2024-05-15: qty 20

## 2024-05-15 MED ORDER — CHLORHEXIDINE GLUCONATE 0.12 % MT SOLN
OROMUCOSAL | Status: AC
  Filled 2024-05-15: qty 15

## 2024-05-15 MED ORDER — LIDOCAINE HCL (PF) 1 % IJ SOLN
INTRAMUSCULAR | Status: DC | PRN
  Administered 2024-05-15: 3 mL via SUBCUTANEOUS

## 2024-05-15 MED ORDER — BUPIVACAINE LIPOSOME 1.3 % IJ SUSP
INTRAMUSCULAR | Status: DC | PRN
  Administered 2024-05-15: 20 mL via PERINEURAL

## 2024-05-15 MED ORDER — MORPHINE SULFATE (PF) 2 MG/ML IV SOLN: 1.0000 mg | INTRAVENOUS | Status: DC | PRN

## 2024-05-15 MED ORDER — LEVOTHYROXINE SODIUM 50 MCG PO TABS
50.0000 ug | ORAL_TABLET | Freq: Every day | ORAL | Status: DC
  Administered 2024-05-16: 50 ug via ORAL
  Filled 2024-05-15: qty 2
  Filled 2024-05-15: qty 1

## 2024-05-15 MED ORDER — PANTOPRAZOLE SODIUM 40 MG PO TBEC
40.0000 mg | DELAYED_RELEASE_TABLET | Freq: Every day | ORAL | Status: DC
  Administered 2024-05-16: 40 mg via ORAL
  Filled 2024-05-15: qty 1

## 2024-05-15 MED ORDER — SODIUM CHLORIDE 0.9 % IR SOLN
Status: DC | PRN
Start: 2024-05-15 — End: 2024-05-15
  Administered 2024-05-15: 2000 mL

## 2024-05-15 MED ORDER — CELECOXIB 100 MG PO CAPS
100.0000 mg | ORAL_CAPSULE | Freq: Two times a day (BID) | ORAL | Status: DC
  Administered 2024-05-15 – 2024-05-16 (×2): 100 mg via ORAL
  Filled 2024-05-15 (×3): qty 1

## 2024-05-15 MED ORDER — EPHEDRINE SULFATE-NACL 50-0.9 MG/10ML-% IV SOSY
PREFILLED_SYRINGE | INTRAVENOUS | Status: DC | PRN
  Administered 2024-05-15: 20 mg via INTRAVENOUS

## 2024-05-15 MED ORDER — FENTANYL CITRATE (PF) 50 MCG/ML IJ SOSY
PREFILLED_SYRINGE | INTRAMUSCULAR | Status: AC
  Filled 2024-05-15: qty 1

## 2024-05-15 MED ORDER — SUGAMMADEX SODIUM 200 MG/2ML IV SOLN
INTRAVENOUS | Status: DC | PRN
  Administered 2024-05-15: 200 mg via INTRAVENOUS

## 2024-05-15 MED ORDER — TRANEXAMIC ACID-NACL 1000-0.7 MG/100ML-% IV SOLN
INTRAVENOUS | Status: AC
  Filled 2024-05-15: qty 100

## 2024-05-15 MED ORDER — MELATONIN 5 MG PO TABS
10.0000 mg | ORAL_TABLET | Freq: Every day | ORAL | Status: DC
  Administered 2024-05-15: 10 mg via ORAL
  Filled 2024-05-15 (×2): qty 2

## 2024-05-15 MED ORDER — CEFAZOLIN SODIUM-DEXTROSE 2-4 GM/100ML-% IV SOLN
INTRAVENOUS | Status: AC
  Filled 2024-05-15: qty 100

## 2024-05-15 MED ORDER — MIDAZOLAM HCL (PF) 2 MG/2ML IJ SOLN
1.0000 mg | INTRAMUSCULAR | Status: DC | PRN
  Administered 2024-05-15: 1 mg via INTRAVENOUS

## 2024-05-15 MED ORDER — BUPIVACAINE HCL (PF) 0.5 % IJ SOLN
INTRAMUSCULAR | Status: AC
  Filled 2024-05-15: qty 10

## 2024-05-15 MED ORDER — ROCURONIUM BROMIDE 10 MG/ML (PF) SYRINGE
PREFILLED_SYRINGE | INTRAVENOUS | Status: AC
  Filled 2024-05-15: qty 10

## 2024-05-15 MED ORDER — ROCURONIUM BROMIDE 100 MG/10ML IV SOLN
INTRAVENOUS | Status: DC | PRN
  Administered 2024-05-15: 50 mg via INTRAVENOUS
  Administered 2024-05-15 (×3): 10 mg via INTRAVENOUS

## 2024-05-15 MED ORDER — PHENYLEPHRINE 80 MCG/ML (10ML) SYRINGE FOR IV PUSH (FOR BLOOD PRESSURE SUPPORT)
PREFILLED_SYRINGE | INTRAVENOUS | Status: DC | PRN
  Administered 2024-05-15: 80 ug via INTRAVENOUS
  Administered 2024-05-15: 160 ug via INTRAVENOUS
  Administered 2024-05-15 (×3): 80 ug via INTRAVENOUS

## 2024-05-15 MED ORDER — LIDOCAINE HCL (PF) 1 % IJ SOLN
INTRAMUSCULAR | Status: AC
  Filled 2024-05-15: qty 5

## 2024-05-15 MED ORDER — EPHEDRINE 5 MG/ML INJ
INTRAVENOUS | Status: AC
  Filled 2024-05-15: qty 5

## 2024-05-15 MED ORDER — DILTIAZEM HCL ER COATED BEADS 240 MG PO CP24
240.0000 mg | ORAL_CAPSULE | Freq: Every day | ORAL | Status: DC
  Administered 2024-05-16: 240 mg via ORAL
  Filled 2024-05-15: qty 1

## 2024-05-15 MED ORDER — FENTANYL CITRATE (PF) 50 MCG/ML IJ SOSY
50.0000 ug | PREFILLED_SYRINGE | Freq: Once | INTRAMUSCULAR | Status: AC
  Administered 2024-05-15: 50 ug via INTRAVENOUS

## 2024-05-15 MED ORDER — FENTANYL CITRATE (PF) 100 MCG/2ML IJ SOLN: 25.0000 ug | INTRAMUSCULAR | Status: DC | PRN

## 2024-05-15 MED ORDER — OXYCODONE HCL 5 MG PO TABS
10.0000 mg | ORAL_TABLET | ORAL | Status: DC | PRN
Start: 2024-05-15 — End: 2024-05-16
  Filled 2024-05-15: qty 2

## 2024-05-15 MED ORDER — LIDOCAINE HCL (CARDIAC) PF 100 MG/5ML IV SOSY
PREFILLED_SYRINGE | INTRAVENOUS | Status: DC | PRN
  Administered 2024-05-15: 100 mg via INTRAVENOUS

## 2024-05-15 MED ORDER — VANCOMYCIN HCL 1000 MG IV SOLR
INTRAVENOUS | Status: AC
  Filled 2024-05-15: qty 20

## 2024-05-15 MED ORDER — DIPHENHYDRAMINE HCL 12.5 MG/5ML PO ELIX
12.5000 mg | ORAL_SOLUTION | ORAL | Status: DC | PRN
  Administered 2024-05-15 – 2024-05-16 (×2): 12.5 mg via ORAL
  Filled 2024-05-15 (×2): qty 5

## 2024-05-15 MED ORDER — LIDOCAINE HCL (PF) 2 % IJ SOLN
INTRAMUSCULAR | Status: AC
  Filled 2024-05-15: qty 5

## 2024-05-15 MED ORDER — ONDANSETRON HCL 4 MG/2ML IJ SOLN
INTRAMUSCULAR | Status: AC
Start: 2024-05-15 — End: 2024-05-15
  Filled 2024-05-15: qty 2

## 2024-05-15 SURGICAL SUPPLY — 70 items
BASEPLATE 24 10D FULL AUGM (Plate) IMPLANT
BENZOIN TINCTURE PRP APPL 2/3 (GAUZE/BANDAGES/DRESSINGS) IMPLANT
BIT DRILL FLUTED 3.0 STRL (BIT) IMPLANT
BLADE SAW 90X13X1.19 OSCILLAT (BLADE) IMPLANT
BLADE SURG SZ10 CARB STEEL (BLADE) ×1 IMPLANT
BNDG GAUZE DERMACEA FLUFF 4 (GAUZE/BANDAGES/DRESSINGS) ×2 IMPLANT
CALIBRATOR GLENOID VIP 5-D (SYSTAGENIX WOUND MANAGEMENT) IMPLANT
CHLORAPREP W/TINT 26 (MISCELLANEOUS) ×2 IMPLANT
COOLER ICEMAN CLASSIC (MISCELLANEOUS) ×1 IMPLANT
COVER LIGHT HANDLE STERIS (MISCELLANEOUS) ×2 IMPLANT
CUP SUT UNIV REVERS 36+2 RT (Cup) IMPLANT
DRAPE INCISE IOBAN 66X45 STRL (DRAPES) ×1 IMPLANT
DRAPE U-SHAPE 47X51 STRL (DRAPES) ×1 IMPLANT
DRSG AQUACEL AG ADV 3.5X10 (GAUZE/BANDAGES/DRESSINGS) ×1 IMPLANT
ELECT BLADE 6 FLAT ULTRCLN (ELECTRODE) ×1 IMPLANT
ELECTRODE REM PT RTRN 9FT ADLT (ELECTROSURGICAL) ×1 IMPLANT
GLENOSPHERE 36 +4 LAT/24 (Joint) IMPLANT
GLOVE BIOGEL PI IND STRL 7.0 (GLOVE) ×4 IMPLANT
GLOVE BIOGEL PI IND STRL 8 (GLOVE) ×1 IMPLANT
GLOVE BIOGEL PI IND STRL 8.5 (GLOVE) ×1 IMPLANT
GLOVE SURG SYN 8.5 PF PI (GLOVE) ×1 IMPLANT
GOWN STRL REUS W/ TWL LRG LVL3 (GOWN DISPOSABLE) ×2 IMPLANT
GOWN STRL REUS W/ TWL XL LVL3 (GOWN DISPOSABLE) ×1 IMPLANT
HANDLE YANKAUER SUCT BULB TIP (MISCELLANEOUS) ×1 IMPLANT
HOOD W/PEELAWAY (MISCELLANEOUS) ×3 IMPLANT
IMPL SHOULDER ARTHROSCOPY L (Shoulder) IMPLANT
KIT STABILIZATION SHOULDER (MISCELLANEOUS) IMPLANT
KIT TURNOVER KIT A (KITS) ×1 IMPLANT
LINER HUMERAL 36 +3MM SM (Shoulder) IMPLANT
MANIFOLD NEPTUNE II (INSTRUMENTS) ×1 IMPLANT
MARKER SKIN DUAL TIP RULER LAB (MISCELLANEOUS) ×1 IMPLANT
NDL HYPO 21X1.5 SAFETY (NEEDLE) IMPLANT
PACK BASIN MINOR ARMC (MISCELLANEOUS) ×1 IMPLANT
PACK SRG BSC III STRL LF ECLPS (CUSTOM PROCEDURE TRAY) ×1 IMPLANT
PACK TOTAL JOINT (CUSTOM PROCEDURE TRAY) ×1 IMPLANT
PAD ABD DERMACEA PRESS 5X9 (GAUZE/BANDAGES/DRESSINGS) IMPLANT
PAD ARMBOARD POSITIONER FOAM (MISCELLANEOUS) ×1 IMPLANT
PAD COLD SHLDR SM WRAP-ON (PAD) ×1 IMPLANT
PIN NITINOL TARGETER 2.8 (PIN) IMPLANT
POSITIONER HEAD 8X9X4 ADT (SOFTGOODS) ×1 IMPLANT
POST MODULAR MGS BASEPLATE 25 (Post) IMPLANT
PRESSURIZER FEM CANAL M (MISCELLANEOUS) ×1 IMPLANT
SCREW PERI LOCK 5.5X24 (Screw) IMPLANT
SCREW PERIPHERAL 5.5X28 LOCK (Screw) IMPLANT
SCREW PERIPHERAL NL 4.5X36 (Screw) IMPLANT
SET HNDPC FAN SPRY TIP SCT (DISPOSABLE) IMPLANT
SET SHOULDER TRAC (MISCELLANEOUS) ×1 IMPLANT
SLING ULTRA II LG (MISCELLANEOUS) IMPLANT
SLING ULTRA II M (MISCELLANEOUS) IMPLANT
SOL .9 NS 3000ML IRR UROMATIC (IV SOLUTION) ×1 IMPLANT
SOLN 0.9% NACL POUR BTL 1000ML (IV SOLUTION) ×1 IMPLANT
SOLN STERILE WATER BTL 1000 ML (IV SOLUTION) ×2 IMPLANT
SPIKE FLUID TRANSFER (MISCELLANEOUS) IMPLANT
SPONGE T-LAP 18X18 ~~LOC~~+RFID (SPONGE) IMPLANT
STEM HUM UNIV REV SHLD 13 (Stem) IMPLANT
STRIP CLOSURE SKIN 1/2X4 (GAUZE/BANDAGES/DRESSINGS) ×2 IMPLANT
SUT MNCRL AB 4-0 PS2 18 (SUTURE) ×1 IMPLANT
SUT MON AB 2-0 CT1 36 (SUTURE) ×1 IMPLANT
SUT VIC AB 0 CT1 27XBRD ANTBC (SUTURE) ×1 IMPLANT
SUT VIC AB 1 CT1 36 (SUTURE) IMPLANT
SUTURE 2 FIBERLOOP 20 STRT BLU (SUTURE) IMPLANT
SUTURE TAPE 1.3 40 TPR END (SUTURE) ×3 IMPLANT
SUTURETAPE 1.3 40 W/NDL BLK/WH (SUTURE) ×3 IMPLANT
SYR 30ML LL (SYRINGE) IMPLANT
SYR BULB IRRIG 60ML STRL (SYRINGE) ×2 IMPLANT
TIP FAN IRRIG PULSAVAC PLUS (DISPOSABLE) IMPLANT
TOWEL OR 17X26 4PK STRL BLUE (TOWEL DISPOSABLE) ×1 IMPLANT
TOWER CARTRIDGE SMART MIX (DISPOSABLE) IMPLANT
TRAY FOLEY SLVR 16FR LF STAT (SET/KITS/TRAYS/PACK) ×1 IMPLANT
TUBING CONNECTING 10 (TUBING) ×1 IMPLANT

## 2024-05-15 NOTE — Transfer of Care (Signed)
 Immediate Anesthesia Transfer of Care Note  Patient: Ricky Hart  Procedure(s) Performed: ARTHROPLASTY, SHOULDER, TOTAL, REVERSE (Right: Shoulder)  Patient Location: PACU  Anesthesia Type:General  Level of Consciousness: awake and alert   Airway & Oxygen Therapy: Patient Spontanous Breathing and Patient connected to face mask oxygen  Post-op Assessment: Report given to RN and Post -op Vital signs reviewed and stable  Post vital signs: stable  Last Vitals:  Vitals Value Taken Time  BP 110/62 05/15/24 15:39  Temp    Pulse 79 05/15/24 15:41  Resp 22 05/15/24 15:41  SpO2 100 % 05/15/24 15:41  Vitals shown include unfiled device data.  Last Pain:  Vitals:   05/15/24 1036  TempSrc: Temporal  PainSc: 5          Complications: No notable events documented.

## 2024-05-15 NOTE — Anesthesia Preprocedure Evaluation (Addendum)
 Anesthesia Evaluation  Patient identified by MRN, date of birth, ID band Patient awake    Reviewed: Allergy & Precautions, H&P , NPO status , Patient's Chart, lab work & pertinent test results, reviewed documented beta blocker date and time   History of Anesthesia Complications Negative for: history of anesthetic complications  Airway Mallampati: I  TM Distance: >3 FB Neck ROM: full    Dental  (+) Upper Dentures, Lower Dentures, Edentulous Upper, Edentulous Lower, Dental Advidsory Given   Pulmonary former smoker   Pulmonary exam normal breath sounds clear to auscultation       Cardiovascular Exercise Tolerance: Good hypertension, (-) angina + CAD and + Cardiac Stents  (-) Past MI Normal cardiovascular exam(-) dysrhythmias (-) Valvular Problems/Murmurs Rhythm:regular Rate:Normal     Neuro/Psych Seizures - (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, none since),  PSYCHIATRIC DISORDERS Anxiety     TIA   GI/Hepatic Neg liver ROS,GERD  ,,  Endo/Other  diabetes (borderline), Well ControlledHypothyroidism    Renal/GU negative Renal ROS  negative genitourinary   Musculoskeletal   Abdominal   Peds  Hematology negative hematology ROS (+)   Anesthesia Other Findings Past Medical History: No date: Arthritis No date: Carotid artery stenosis     Comment:  a.) s/p RICA angioplasty and stent placement; b.) s/p               RIGHT CEA No date: Cervical radiculopathy No date: Coronary artery disease No date: Full dentures No date: GERD (gastroesophageal reflux disease) No date: Hemangioma No date: Hypertension No date: Hypothyroidism No date: Pre-diabetes No date: PTSD (post-traumatic stress disorder) No date: Right rotator cuff tear 1980: 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) No date: Stroke (HCC) No date: Venous insufficiency No  date: Wears hearing aid   Reproductive/Obstetrics negative OB ROS                              Anesthesia Physical Anesthesia Plan  ASA: 3  Anesthesia Plan: General   Post-op Pain Management: Regional block*   Induction: Intravenous  PONV Risk Score and Plan: 2 and Ondansetron , Dexamethasone  and Treatment may vary due to age or medical condition  Airway Management Planned: Oral ETT  Additional Equipment:   Intra-op Plan:   Post-operative Plan: Extubation in OR  Informed Consent: I have reviewed the patients History and Physical, chart, labs and discussed the procedure including the risks, benefits and alternatives for the proposed anesthesia with the patient or authorized representative who has indicated his/her understanding and acceptance.     Dental Advisory Given  Plan Discussed with: Anesthesiologist, CRNA and Surgeon  Anesthesia Plan Comments:          Anesthesia Quick Evaluation

## 2024-05-15 NOTE — Interval H&P Note (Signed)
 History and Physical Interval Note:  05/15/2024 12:43 PM  Ricky Hart  has presented today for surgery, with the diagnosis of Right shoulder rotator cuff tear.  The various methods of treatment have been discussed with the patient and family. After consideration of risks, benefits and other options for treatment, the patient has consented to  Procedure(s): ARTHROPLASTY, SHOULDER, TOTAL, REVERSE (Right) as a surgical intervention.  The patient's history has been reviewed, patient examined, no change in status, stable for surgery.  I have reviewed the patient's chart and labs.  Questions were answered to the patient's satisfaction.     [name]

## 2024-05-15 NOTE — Anesthesia Postprocedure Evaluation (Signed)
 Anesthesia Post Note  Patient: Ricky Hart  Procedure(s) Performed: ARTHROPLASTY, SHOULDER, TOTAL, REVERSE (Right: Shoulder)  Patient location during evaluation: PACU Anesthesia Type: General Level of consciousness: awake and alert Pain management: pain level controlled Vital Signs Assessment: post-procedure vital signs reviewed and stable Respiratory status: spontaneous breathing, nonlabored ventilation, respiratory function stable and patient connected to nasal cannula oxygen Cardiovascular status: blood pressure returned to baseline and stable Postop Assessment: no apparent nausea or vomiting Anesthetic complications: no   No notable events documented.   Last Vitals:  Vitals:   05/15/24 1630 05/15/24 1645  BP: 108/64 107/63  Pulse: 69 76  Resp: 19 19  Temp:    SpO2: 91% 92%    Last Pain:  Vitals:   05/15/24 1630  TempSrc:   PainSc: 0-No pain                 Lendia LITTIE Mae

## 2024-05-15 NOTE — Anesthesia Procedure Notes (Signed)
 Procedure Name: Intubation Date/Time: 05/15/2024 1:03 PM  Performed by: Gillermo Spruce I, CRNAPre-anesthesia Checklist: Patient identified, Emergency Drugs available, Suction available and Patient being monitored Patient Re-evaluated:Patient Re-evaluated prior to induction Oxygen Delivery Method: Circle system utilized Preoxygenation: Pre-oxygenation with 100% oxygen Induction Type: IV induction Ventilation: Mask ventilation without difficulty Laryngoscope Size: McGrath and 4 Grade View: Grade I Tube type: Oral Tube size: 7.5 mm Number of attempts: 1 Airway Equipment and Method: Stylet and Oral airway Placement Confirmation: ETT inserted through vocal cords under direct vision, positive ETCO2 and breath sounds checked- equal and bilateral Secured at: 22 cm Tube secured with: Tape Dental Injury: Teeth and Oropharynx as per pre-operative assessment

## 2024-05-15 NOTE — Anesthesia Procedure Notes (Signed)
 Anesthesia Regional Block: Interscalene brachial plexus block   Pre-Anesthetic Checklist: , timeout performed,  Correct Patient, Correct Site, Correct Laterality,  Correct Procedure, Correct Position, site marked,  Risks and benefits discussed,  Surgical consent,  Pre-op evaluation,  At surgeon's request and post-op pain management  Laterality: Right and Upper  Prep: chloraprep       Needles:  Injection technique: Single-shot  Needle Type: Stimiplex     Needle Length: 5cm  Needle Gauge: 22     Additional Needles:   Procedures:,,,, ultrasound used (permanent image in chart),,    Narrative:  Start time: 05/15/2024 12:48 PM End time: 05/15/2024 12:51 PM Injection made incrementally with aspirations every 5 mL.  Performed by: Personally  Anesthesiologist: Dario Barter, MD  Additional Notes: Functioning IV was confirmed and monitors were applied.  A 50mm 22ga Stimuplex needle was used. Sterile prep and drape,hand hygiene and sterile gloves were used.  Negative aspiration and negative test dose prior to incremental administration of local anesthetic. The patient tolerated the procedure well.

## 2024-05-15 NOTE — Op Note (Signed)
 Orthopaedic Surgery Operative Note (CSN: [id number])  Ricky Hart  [birth year] Date of Surgery: 05/15/2024   Diagnoses:  Right shoulder rotator cuff tear  Procedure: Right reverse shoulder arthroplasty   Operative Finding Successful completion of the planned procedure.    Post-Op Diagnosis: Same Surgeons:Primary: Onesimo Oneil LABOR, MD Assistants:  Vena Rubin Location: [facility] OR ROOM 08 Anesthesia: General with regional anesthesia Antibiotics: Ancef  2 g with local vancomycin  powder 1 g at the surgical site Tourniquet time: N/A Estimated Blood Loss: 250 cc Complications: None Specimens: None  Implants: Implant Name Type Inv. Item Serial No. Manufacturer Lot No. LRB No. Used Action  BASEPLATE 24 10D FULL AUGM - [id number] Plate BASEPLATE 24 10D FULL AUGM  [employer] [id number] Right 1 Implanted  POST MODULAR MGS BASEPLATE 25 - [id number] Post POST MODULAR MGS BASEPLATE 25  [employer] [id number] Right 1 Implanted  GLENOSPHERE 36 +4 LAT/24 - [id number] Joint GLENOSPHERE 36 +4 LAT/24  [employer] [id number] Right 1 Implanted  SCREW PERIPHERAL NL 4.5X36 - [id number] Screw SCREW PERIPHERAL NL 4.5X36  [employer] [id number] Right 1 Implanted  SCREW PERIPHERAL 5.5X28 LOCK - [id number] Screw SCREW PERIPHERAL 5.5X28 LOCK  [employer] [id number] Right 1 Implanted  SCREW PERI LOCK 5.5X24 - [id number] Screw SCREW PERI LOCK 5.5X24  [employer] [id number] Right 1 Implanted  STEM HUM UNIV REV SHLD 13 - [id number] Stem STEM HUM UNIV REV SHLD 13  [employer] [id number] Right 1 Implanted  CUP SUT UNIV REVERS 36+2 RT - [id number] Cup CUP SUT UNIV REVERS 36+2 RT  [employer] [id number] Right 1 Implanted  LINER HUMERAL 36 +3MM SM - [id number] Shoulder LINER HUMERAL 36 +3MM SM  [employer] [id number] Right 1 Implanted    Indications for Surgery:   Ricky Hart is a 78 y.o. male with a large, retracted rotator cuff tear, with underlying arthritis.  He has continued pain, refractory to nonoperative management.   Benefits and risks of operative and nonoperative management were discussed prior to surgery with the patient and informed consent form was completed.  Specific risks including, but not limited to, infection, need for additional surgery, bleeding, persistent pain, non-union, implant loosening, malunion, persistent pain, stiffness, dislocation and more severe complications associated with anesthesia were discussed with the patient.  The patient has elected to proceed.  Surgical consent was finalized.  Procedure:   The patient was identified properly. Informed consent was obtained and the surgical site was marked. The patient was taken up to operating room where general anesthesia was induced.  The patient was positioned in beach chair position.  The right shoulder was prepped and draped in the usual sterile fashion.  Timeout was performed before the beginning of the case.  The patient received appropriate antibiotics prior to making incision.  In addition, the patient received 1 g TXA prior to starting.  This was confirmed during the preincision timeout.  We made an incision for the standard deltopectoral approach was performed with a #10 blade. We dissected down through the subcutaneous tissues and the cephalic vein was taken laterally with the deltoid. The clavipectoral fascia was incised in line with the incision. Deep retractors were placed. The long of the biceps tendon was identified and there was significant tenosynovitis present.  A biceps tenodesis was performed to the pectoralis tendon with #2 Fiberwire. The remaining biceps was followed up into the rotator interval where it was released.   The subscapularis was taken down in a full thickness layer with capsule along the humeral neck  extending inferiorly around the humeral head. We continued releasing the capsule directly off of the osteophytes inferiorly all the way around the corner. This allowed us  to dislocate the humeral head. Multiple tag  stitches were placed in the subscapularis tendon to maintain control during the release of the tendon, and for the remainder of the case.   The humeral head had evidence of severe osteoarthritic wear with full-thickness cartilage loss and exposed subchondral bone.  In addition, the rotator cuff was carefully examined and noted to be irreperably torn.  The decision was confirmed that a reverse total shoulder was indicated for this patient.  There were osteophytes along the inferior humeral neck. The osteophytes were removed using an osteotome and a rongeur.  At this point, the anatomic neck was well visualized.      A humeral osteotomy was performed with an oscillating saw. The head fragment was passed off to the back table, and used to estimate the humeral head size for our implant.  A cut protector was placed.  The humerus was retracted posteriorly and we turned our attention to glenoid exposure.  The subscapularis was again identified and we took care to palpate the axillary nerve anteriorly and verify its position with gentle palpation as well as the tug test.  We then released the SGHL with bovie cautery prior to placing a curved mayo at the junction of the anterior glenoid well above the axillary nerve and bluntly dissecting the subscapularis from the capsule.  We then carefully protected the axillary nerve as we gently released the inferior capsule to fully mobilize the subscapularis.  An anterior deltoid retractor was then placed as well as a small Hohmann retractor superiorly.   The glenoid was inspected and had evidence of moderate osteoarthritic wear with partial thickness cartilage loss. The remaining labrum was removed circumferentially taking great care not to disrupt the posterior capsule.   Based on preoperative templating, the drill guide was assembled on the back table.  The glenoid face was then reamed eccentrically over the guide wire. The center hole was drilled over the guidepin in a  near anatomic angle of version. Next the glenoid vault was drilled back to a depth of 25 mm.  We pressfit a 24 mm size baseplate with 4 mm lateralization was selected with a 25 mm length central post.  The base plate was pressfit into the glenoid vault obtaining secure fixation. We next placed superior and inferior locking screws for additional fixation.  Next a 36 mm glenosphere was selected and impacted onto the baseplate. The center screw was tightened.       We turned attention back to the humeral side. The cut protector was removed. A humeral cutting guide with a version rod was secured to the anterior aspect of the humeral head. The version was set at 20 of retroversion.  A starter awl was used to open the humeral canal. We next used T-handle straight reamers to ream up to an appropriate fit. We then broached starting with a size 5 broach and broaching up to 13 which obtained an appropriate fit. The broach handle was removed.  We trialed with multiple size tray and polyethylene options and selected a 36+3 which provided good stability and range of motion without excess soft tissue tension. The offset was dialed in to match the normal anatomy. The shoulder was trialed.  There was good ROM in all planes and the shoulder was stable with no inferior translation.     The real humeral  implants were opened after again confirming sizes.  The trial was removed. Sutures were passed through the eyelet on the stem to repair the subscapularis.  The humeral component was press-fit obtaining a secure fit. A posterior offset tray was selected and impacted onto the stem.  A 36+3 polyethylene liner was impacted onto the stem.  The joint was reduced and thoroughly irrigated with pulsatile lavage. Subscap was repaired back to the eyelets. Hemostasis was obtained. The deltopectoral interval was reapproximated with #1 Vicryl. The subcutaneous tissues were closed with 2-0 monocryl and the skin was closed with running  monocryl.    The wounds were cleaned and dried and an Aquacel dressing was placed. The drapes taken down. The arm was placed into sling with abduction pillow. Patient was awakened, extubated, and transferred to the recovery room in stable condition. There were no intraoperative complications. The sponge, needle, and attention counts were  correct at the end of the case.   Post-operative plan:  The patient will be admitted for over night observation.   We have placed a referral for PT to begin 1-2 weeks postop, prior to the first postoperative clinic visit.  DVT prophylaxis Aspirin 81 mg twice daily for 6 weeks.    Pain control with PRN pain medication preferring oral medicines.   Follow up plan will be scheduled in approximately 10-14 days for incision check and XR.

## 2024-05-15 NOTE — H&P (Signed)
 Below is the most recent clinic note for KAITO SCHULENBURG; any pertinent information regarding their recent medical history will be updated on the day of surgery.   New Patient Visit  Assessment: TREVOR WILKIE is a 78 y.o. male with the following: 1. Complete tear of right rotator cuff, unspecified whether traumatic  Plan: KARLO GOEDEN has pain in the right shoulder.  This been ongoing for several years.  It has been especially bad for the past year.  He has a massive rotator cuff tear, full-thickness tears of the supraspinatus and the infraspinatus, with retraction.  Given the constellation of symptoms, and the appearance on the MRI, I do not think that these tears are repairable.  I recommended a reverse shoulder arthroplasty.  After discussing multiple treatment options, including conservative management, he elected to proceed with surgery.  Procedure was discussed in detail.  We will obtain medical clearance through the [facility]  We will obtain a CT scan for preoperative planning  Once the steps are completed, we can schedule a date.  Risks and benefits of the surgery, including, but not limited to infection, bleeding, persistent pain, need for further surgery, poor union of the implants, stiffness, blood clots and more severe complications associated with anesthesia were discussed with the patient.  The patient has elected to proceed.  Follow-up: After surgery  Subjective:  Right shoulder pain   History of Present Illness: AIZEN DUVAL is a 78 y.o. male who presents for evaluation of right shoulder pain.  He is right-hand dominant.  He reports he has had pain in the right shoulder for over a year.  It has been progressively worsening.  It may have been bothering him for longer.  No specific injury.  He was a corporate investment banker.  Medications are not providing sufficient relief.  He has had multiple injections, with very little improvement in his symptoms.  He notes radiating pains from the  shoulder into the right arm.  He does have some numbness and tingling.  He has previously had an EMG which demonstrates moderate right carpal tunnel syndrome   Review of Systems: No fevers or chills No numbness or tingling No chest pain No shortness of breath No bowel or bladder dysfunction No GI distress No headaches   Medical History:  Past Medical History:  Diagnosis Date   Arthritis    Carotid artery stenosis    a.) s/p RICA angioplasty and stent placement; b.) s/p RIGHT CEA   Cervical radiculopathy    Coronary artery disease    Full dentures    GERD (gastroesophageal reflux disease)    Hemangioma    Hypertension    Hypothyroidism    Pre-diabetes    PTSD (post-traumatic stress disorder)    Right rotator cuff tear    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   Squamous cell carcinoma of tonsil (HCC)    Stroke (HCC)    Venous insufficiency    Wears hearing aid     Past Surgical History:  Procedure Laterality Date   CAROTID ARTERY ANGIOPLASTY AND STENT PLACEMENT Right    CAROTID ENDARTERECTOMY Right    COLONOSCOPY     x3   COLONOSCOPY N/A 11/29/2023   Procedure: COLONOSCOPY;  Surgeon: Maryruth Ole DASEN, MD;  Location: [facility] ENDOSCOPY;  Service: Endoscopy;  Laterality: N/A;   COLONOSCOPY WITH PROPOFOL  N/A 09/01/2022   Procedure: COLONOSCOPY WITH PROPOFOL ;  Surgeon: Maryruth Ole DASEN, MD;  Location: [facility] ENDOSCOPY;  Service: Endoscopy;  Laterality: N/A;   ESOPHAGOGASTRODUODENOSCOPY (EGD) WITH PROPOFOL  N/A 09/01/2022   Procedure: ESOPHAGOGASTRODUODENOSCOPY (EGD) WITH PROPOFOL ;  Surgeon: Maryruth Ole DASEN, MD;  Location: [facility] ENDOSCOPY;  Service: Endoscopy;  Laterality: N/A;   portacath removal     RESECTION TONSIL RADICAL Right     History reviewed. No pertinent family history. Social History   Tobacco Use   Smoking status: Former    Types: Cigarettes   Smokeless tobacco: Never  Vaping Use   Vaping status: Never Used   Substance Use Topics   Alcohol use: Not Currently   Drug use: Never    Allergies  Allergen Reactions   Codeine Itching, Dermatitis and Other (See Comments)    Current Meds  Medication Sig   aspirin EC 81 MG tablet Take 81 mg by mouth daily. Swallow whole.   bismuth subsalicylate (PEPTO BISMOL) 262 MG chewable tablet Chew 524 mg by mouth as needed.   carboxymethylcellulose (REFRESH PLUS) 0.5 % SOLN Place 1 drop into both eyes in the morning, at noon, in the evening, and at bedtime.   celecoxib  (CELEBREX ) 100 MG capsule Take 100 mg by mouth 2 (two) times daily.   cholecalciferol (VITAMIN D3) 25 MCG (1000 UNIT) tablet Take 1,000 Units by mouth daily.   Cyanocobalamin (B-12 PO) Take 3,000 mcg by mouth daily.   diltiazem  (CARDIZEM  CD) 240 MG 24 hr capsule Take 240 mg by mouth every morning.   DULoxetine  (CYMBALTA ) 30 MG capsule Take 90 mg by mouth at bedtime.   guaiFENesin  (MUCINEX ) 600 MG 12 hr tablet Take 600 mg by mouth 2 (two) times daily as needed.   levothyroxine  (SYNTHROID ) 50 MCG tablet Take 50 mcg by mouth daily before breakfast.   loratadine  (CLARITIN ) 10 MG tablet Take 10 mg by mouth daily.   Melatonin 10 MG TABS Take 10 mg by mouth at bedtime.   Multiple Vitamin (MULTIVITAMIN WITH MINERALS) TABS tablet Take 1 tablet by mouth daily.   omeprazole (PRILOSEC) 20 MG capsule Take 20 mg by mouth in the morning and at bedtime.   simethicone (MYLICON) 125 MG chewable tablet Chew 125 mg by mouth every 6 (six) hours as needed for flatulence.   sodium chloride  (OCEAN) 0.65 % SOLN nasal spray Place 2 sprays into both nostrils as needed for congestion.   traMADol  (ULTRAM ) 50 MG tablet Take 1 tablet (50 mg total) by mouth every 6 (six) hours as needed.    Objective: BP 138/75 (BP Location: Left Arm)   Pulse 66   Temp 98.1 F (36.7 C) (Temporal)   Resp 17   SpO2 100%   Physical Exam:  General: Alert and oriented. and No acute distress. Gait: Normal gait.  Right shoulder without  deformity.  160 degrees of forward flexion with obvious discomfort.  Internal rotation of the lumbar spine.  Slightly restricted external rotation.  Positive Jobes.  4/5 supraspinatus and infraspinatus strength.  Negative belly press.  IMAGING: I personally reviewed images previously obtained in clinic  Right shoulder MRI  IMPRESSION: Large retracted full-thickness tears of the supraspinatus and infraspinatus tendons. No significant muscle belly atrophy. 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   Small interstitial tear to the cranial subscapularis insertion.   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   Small nondisplaced degenerative tear to the superior/posterior labrum.  New Medications:  Meds ordered this encounter  Medications   ceFAZolin  (ANCEF ) IVPB 2g/100 mL premix    Indication::   Surgical Prophylaxis   tranexamic acid  (CYKLOKAPRON )  IVPB 1,000 mg   lactated ringers  infusion   OR Linked Order Group    chlorhexidine  (PERIDEX ) 0.12 % solution 15 mL    Oral care mouth rinse   fentaNYL  (SUBLIMAZE ) injection 50 mcg   midazolam  PF (VERSED ) injection 1 mg      [name], MD  05/15/2024 12:42 PM

## 2024-05-16 ENCOUNTER — Encounter: Payer: Self-pay | Admitting: Orthopedic Surgery

## 2024-05-16 DIAGNOSIS — M75121 Complete rotator cuff tear or rupture of right shoulder, not specified as traumatic: Secondary | ICD-10-CM | POA: Diagnosis not present

## 2024-05-16 MED ORDER — CELECOXIB 100 MG PO CAPS: 100.0000 mg | ORAL_CAPSULE | Freq: Every day | ORAL | 0 refills | Status: AC

## 2024-05-16 MED ORDER — OXYCODONE HCL 5 MG PO TABS
5.0000 mg | ORAL_TABLET | ORAL | 0 refills | Status: AC | PRN
Start: 2024-05-16 — End: 2024-05-23

## 2024-05-16 MED ORDER — ONDANSETRON HCL 4 MG PO TABS: 4.0000 mg | ORAL_TABLET | Freq: Three times a day (TID) | ORAL | 0 refills | Status: AC | PRN

## 2024-05-16 MED ORDER — ACETAMINOPHEN 500 MG PO TABS: 1000.0000 mg | ORAL_TABLET | Freq: Four times a day (QID) | ORAL | 2 refills | Status: AC | PRN

## 2024-05-16 NOTE — Progress Notes (Signed)
 PT Cancellation Note  Patient Details Name: Ricky Hart MRN: [id number] DOB: 1946-06-08   Cancelled Treatment:    Reason Eval/Treat Not Completed: PT screened, no needs identified, will sign off (Consult received and chart reveiwed.  Per OT evaluation, education complete, patient with no skilled PT needs at this time.  Will complete intial order; please reconsult should needs change.)   Shalon Salado H. Delores, PT, DPT, NCS 05/16/24, 12:28 PM [phone]

## 2024-05-16 NOTE — Plan of Care (Signed)
   Problem: Education: Goal: Knowledge of General Education information will improve Description Including pain rating scale, medication(s)/side effects and non-pharmacologic comfort measures Outcome: Progressing

## 2024-05-16 NOTE — Discharge Summary (Signed)
 Patient ID: Ricky Hart MRN: [id number] DOB/AGE: [birth year] 78 y.o.  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  Tolerated procedure well.  Was kept for monitoring overnight for pain control and medical monitoring postop and was found to be stable for DC home the morning after surgery.  Patient was evaluated by OT/OT prior to discharge.  Patient was instructed on specific activity restrictions and all questions were answered.  Patient was discharged on POD#1 in stable condition.  They will contact the clinic if they have any concerns upon discharge.    Consults: None  Significant Diagnostic Studies: No additional pertinent studies  Treatments: Surgery  Discharge Exam:  Vitals:   05/16/24 0452 05/16/24 0733  BP: 136/73 (!) 140/68  Pulse: 85 82  Resp: 16 15  Temp: 97.8 F (36.6 C) 97.6 F (36.4 C)  SpO2: 98% 97%    Alert and oriented, no acute distress  Sling and ice machine fitting appropriately. Dressing is clean dry and intact Active motion intact throughout the hand Sensation intact in the axillary  nerve distribution. Fingers are warm and well perfused   CBC    Latest Ref Rng & Units 05/15/2024    4:20 PM 05/04/2024    1:20 PM  CBC  WBC 4.0 - 10.5 K/uL  5.4   Hemoglobin 13.0 - 17.0 g/dL 88.4  86.7   Hematocrit 39.0 - 52.0 % 35.1  40.2   Platelets 150 - 400 K/uL  228         Disposition: Discharge disposition: 01-Home or Self Care       Discharge Instructions     Discharge patient   Complete by: As directed    After PT/OT eval   Discharge disposition: 01-Home or Self Care   Discharge patient date: 05/16/2024      Allergies as of 05/16/2024       Reactions   Codeine Itching, Dermatitis, Other (See Comments)        Medication List     STOP taking these medications    traMADol  50 MG tablet Commonly known as: ULTRAM        TAKE these medications    acetaminophen  500 MG tablet Commonly known as: TYLENOL  Take 2 tablets (1,000 mg total) by mouth every 6 (six) hours as needed.   aspirin EC 81 MG tablet Take 81 mg by mouth daily. Swallow whole.   B-12 PO Take 3,000 mcg by mouth daily.   bismuth subsalicylate 262 MG chewable tablet Commonly known as: PEPTO BISMOL  Chew 524 mg by mouth as needed.   carboxymethylcellulose 0.5 % Soln Commonly known as: REFRESH PLUS Place 1 drop into both eyes in the morning, at noon, in the evening, and at bedtime.   celecoxib  100 MG capsule Commonly known as: CeleBREX  Take 1 capsule (100 mg total) by mouth daily for 14 days. What changed: when to take this   cholecalciferol 25 MCG (1000 UNIT) tablet Commonly known as: VITAMIN D3 Take 1,000 Units by mouth daily.   diltiazem  240 MG 24 hr capsule Commonly known as: CARDIZEM  CD Take 240 mg by mouth every morning.   DULoxetine  30 MG capsule Commonly known as: CYMBALTA  Take 90 mg by mouth at bedtime.   guaiFENesin  600 MG 12 hr tablet Commonly known as: MUCINEX  Take 600 mg by mouth 2 (two) times daily as needed.   levothyroxine  50 MCG tablet Commonly  known as: SYNTHROID  Take 50 mcg by mouth daily before breakfast.   loratadine  10 MG tablet Commonly known as: CLARITIN  Take 10 mg by mouth daily.   Melatonin 10 MG Tabs Take 10 mg by mouth at bedtime.   multivitamin with minerals Tabs tablet Take 1 tablet by mouth daily.   omeprazole 20 MG capsule Commonly known as: PRILOSEC Take 20 mg by mouth in the morning and at bedtime.   ondansetron  4 MG tablet Commonly known as: Zofran  Take 1 tablet (4 mg total) by mouth every 8 (eight) hours as needed for up to 14 days for nausea or vomiting.   oxyCODONE  5 MG immediate release tablet Commonly known as: Roxicodone  Take 1 tablet (5 mg total) by mouth every 4 (four) hours as needed for up to 7 days.   simethicone 125 MG chewable tablet Commonly known as: MYLICON Chew 125 mg by mouth every 6 (six) hours as needed for flatulence.   sodium chloride  0.65 % Soln nasal spray Commonly known as: OCEAN Place 2 sprays into both nostrils as needed for congestion.        Follow-up Information     Onesimo Oneil LABOR, MD Follow up.   Specialties: Orthopedic Surgery, Sports Medicine Why: 10-14 days for suture removal/incision check; 12/11 Contact information: [address] [place] [address] [phone]

## 2024-05-16 NOTE — Progress Notes (Signed)
 DISCHARGE NOTE:   Pt dc with IV removed and dc instructions given. Pt received education on polar care and how to manage it. Pt voices no questions or concerns at this time. Pt wheeled down to medical mall entrance by staff. Pt's family provided transportation.

## 2024-05-16 NOTE — Discharge Instructions (Signed)
 Monie Shere A. Onesimo, MD MS [facility] [address] [place],  [place]  [address] Phone: [phone] Fax: [phone]    POST-OPERATIVE INSTRUCTIONS - TOTAL SHOULDER REPLACEMENT    WOUND CARE You may leave the operative dressing in place until your follow-up appointment. KEEP THE INCISIONS CLEAN AND DRY. There may be a small amount of fluid/bleeding leaking at the surgical site. This is normal after surgery.  If it fills with liquid or blood please call us  immediately to change it for you. Use the provided ice machine or Ice packs as often as possible for the first 3-4 days, then as needed for pain relief.  Keep a layer of cloth or a shirt between your skin and the cooling unit to prevent frost bite as it can get very cold.  SHOWERING: - You may shower on Post-Op Day #3.  - The dressing is water  resistant but do not scrub it as it may start to peel up.   - You may remove the sling for showering, but keep a water  resistant pillow under the arm to keep both the  elbow and shoulder away from the body (mimicking the abduction sling).  - Gently pat the area dry.  - Do not soak the shoulder in water . Do not go swimming in the pool or ocean until your sutures are removed. - KEEP THE INCISIONS CLEAN AND DRY.  EXERCISES Wear the sling at all times except when doing your exercises. You may remove the sling for showering, but keep the arm across the chest or in a secondary sling.    Accidental/Purposeful External Rotation and shoulder flexion (reaching behind you) is to be avoided at all costs for the first month. It is ok to come out of your sling if your are sitting and have assistance for eating.  Do not lift anything heavier than 1 pound until we discuss it further in clinic. Please perform the exercises:   Elbow / Hand / Wrist  Range of Motion Exercises Grip strengthening   REGIONAL ANESTHESIA (NERVE BLOCKS) The anesthesia team may have performed a nerve block  for you if safe in the setting of your care.  This is a great tool used to minimize pain.  Typically the block may start wearing off overnight but the long acting medicine may last for 3-4 days.  The nerve block wearing off can be a challenging period but please utilize your as needed pain medications to try and manage this period.    POST-OP MEDICATIONS- Multimodal approach to pain control In general your pain will be controlled with a combination of substances.  Prescriptions unless otherwise discussed are electronically sent to your pharmacy.  This is a carefully made plan we use to minimize narcotic use.     Meloxicam OR Celebrex  - Anti-inflammatory medication taken on a scheduled basis Acetaminophen  - Non-narcotic pain medicine taken on a scheduled basis  Oxycodone  - This is a strong narcotic, to be used only on an "as needed" basis for pain. Aspirin 81mg  - This medicine is used to minimize the risk of blood clots after surgery.  Please take this twice daily.  Zofran  -  take as needed for nausea  Meloxicam/Celebrex  - these are anti-inflammatory and pain relievers.  Do not take additional ibuprofen, naproxen or other NSAID while taking this medicine.   FOLLOW-UP If you develop a Fever (>101.5), Redness or Drainage from the surgical incision site, please call our office to arrange for an evaluation. Please call  the office to schedule a follow-up appointment for a wound check, 7-10 days post-operatively.   Narcotic Management   Per [facility] clinic policy, our goal is ensure optimal postoperative pain control with a multimodal pain management strategy.  For all [facility] patients, our goal is to wean post-operative narcotic medications by 6 weeks post-operatively.   If this is not possible due to utilization of pain medication prior to surgery, your [facility] doctor will support your acute post-operative pain control for the first 6 weeks postoperatively, with a plan to transition you back  to your primary pain team following that. [facility] will work to ensure a therapist, occupational.

## 2024-05-16 NOTE — Plan of Care (Signed)

## 2024-05-16 NOTE — Evaluation (Signed)
 Occupational Therapy Evaluation Patient Details Name: Ricky Hart MRN: [id number] DOB: 1946/05/09 Today's Date: 05/16/2024   History of Present Illness   78 year old s/p right reverse shoulder arthroplasty with biceps tenodesis. PMH of CAD, GERD, HTN, PTSD, stroke     Clinical Impressions Pt was seen for an OT evaluation this date. Prior to surgery, pt was active and independent. He will be moving in with his ex wife and disabled son upon DC with ramped entry. Pt has orders for RUE to be immobilized and will be NWBing per MD. He presents with impaired strength/ROM and pain resulting in a decreased ability to perform self care tasks requiring max assist for UB/LB dressing and max assist for application of polar care, compression stockings, and sling/immobilizer. Pt and spouse instructed in polar care mgt, compression stockings mgt, sling/immobilizer mgt, ROM exercises for RUE, RUE precautions, adaptive strategies for ADL performance, positioning and considerations for sleep, and home/routines modifications to maximize falls prevention, safety, and independence. Handout provided. Transfers, bed mobility and ambulation performed with MOD I to supervision during session. OT adjusted sling/immobilizer and polar care to improve comfort, optimize positioning, and to maximize skin integrity/safety. Pt verbalized understanding of all education/training provided.  Recommend HHOT services to continue therapy to maximize return to PLOF, address home/routines modifications and safety, minimize falls risk, and minimize caregiver burden.        If plan is discharge home, recommend the following:   A lot of help with bathing/dressing/bathroom;Assistance with cooking/housework;Help with stairs or ramp for entrance     Functional Status Assessment   Patient has not had a recent decline in their functional status     Equipment Recommendations   None recommended by OT     Recommendations for Other  Services         Precautions/Restrictions   Precautions Precautions: Shoulder Shoulder Interventions: Shoulder sling/immobilizer;At all times;Off for dressing/bathing/exercises Precaution Booklet Issued: Yes (comment) Recall of Precautions/Restrictions: Intact Restrictions Weight Bearing Restrictions Per Provider Order: Yes RUE Weight Bearing Per Provider Order: Non weight bearing     Mobility Bed Mobility Overal bed mobility: Needs Assistance Bed Mobility: Supine to Sit     Supine to sit: Supervision     General bed mobility comments: increased time effort    Transfers Overall transfer level: Modified independent                 General transfer comment: supervision to MOD I for standing and ambulation during session      Balance Overall balance assessment: Modified Independent                                         ADL either performed or assessed with clinical judgement   ADL Overall ADL's : Needs assistance/impaired                 Upper Body Dressing : Maximal assistance;Sitting Upper Body Dressing Details (indicate cue type and reason): to doff/donn immobilizer and polar care and donn shirt Lower Body Dressing: Maximal assistance;Sitting/lateral leans;Sit to/from stand Lower Body Dressing Details (indicate cue type and reason): donn underwear and pants             Functional mobility during ADLs: Supervision/safety       Vision         Perception         Praxis  Pertinent Vitals/Pain Pain Assessment Pain Assessment: Faces Faces Pain Scale: Hurts a little bit Pain Location: R shoulder Pain Intervention(s): Monitored during session, Repositioned     Extremity/Trunk Assessment Upper Extremity Assessment Upper Extremity Assessment: Right hand dominant;RUE deficits/detail RUE Deficits / Details: s/p reverse shoulder replacement   Lower Extremity Assessment Lower Extremity Assessment: Overall  WFL for tasks assessed       Communication Communication Communication: Impaired Factors Affecting Communication: Hearing impaired   Cognition Arousal: Alert Behavior During Therapy: WFL for tasks assessed/performed, Restless Cognition: No apparent impairments                               Following commands: Intact       Cueing  General Comments          Exercises Other Exercises Other Exercises: Edu on role of therapy, polar care, donning/doffing sling/immobilizer, modification for ADL performance while maintaining shoulder precautions, ted hose management ,etc.   Shoulder Instructions      Home Living Family/patient expects to be discharged to:: Private residence Living Arrangements: Spouse/significant other Available Help at Discharge: Family Type of Home: House Home Access: Ramped entrance           Bathroom Shower/Tub: Walk-in shower         Home Equipment: Shower seat   Additional Comments: pt will be moving in with ex wife and 79 year old disabled son-entered info on her home      Prior Functioning/Environment Prior Level of Function : Independent/Modified Independent;Driving                    OT Problem List: Pain;Decreased range of motion;Decreased strength;Impaired UE functional use   OT Treatment/Interventions:        OT Goals(Current goals can be found in the care plan section)       OT Frequency:       Co-evaluation              AM-PAC OT 6 Clicks Daily Activity     Outcome Measure Help from another person eating meals?: A Little Help from another person taking care of personal grooming?: A Little Help from another person toileting, which includes using toliet, bedpan, or urinal?: A Little Help from another person bathing (including washing, rinsing, drying)?: A Lot Help from another person to put on and taking off regular upper body clothing?: A Lot Help from another person to put on and taking off  regular lower body clothing?: A Lot 6 Click Score: 15   End of Session Nurse Communication: Mobility status  Activity Tolerance: Patient tolerated treatment well Patient left: in chair;with family/visitor present  OT Visit Diagnosis: Other abnormalities of gait and mobility (R26.89)                Time: 8879-8793 OT Time Calculation (min): 46 min Charges:  OT General Charges $OT Visit: 1 Visit OT Evaluation $OT Eval Moderate Complexity: 1 Mod OT Treatments $Self Care/Home Management : 23-37 mins  Xeng Kucher Chrismon, OTR/L 05/16/24, 1:54 PM   Mcarthur Ivins E Chrismon 05/16/2024, 1:52 PM

## 2024-05-24 ENCOUNTER — Telehealth: Payer: Self-pay | Admitting: Orthopedic Surgery

## 2024-05-24 ENCOUNTER — Other Ambulatory Visit: Payer: Self-pay

## 2024-05-24 DIAGNOSIS — M75121 Complete rotator cuff tear or rupture of right shoulder, not specified as traumatic: Secondary | ICD-10-CM

## 2024-05-24 DIAGNOSIS — Z96611 Presence of right artificial shoulder joint: Secondary | ICD-10-CM

## 2024-05-24 NOTE — Telephone Encounter (Signed)
 Pt just had surgery and has a followup with you next week. Is he suppose to be getting into therapy prior to this appt?

## 2024-05-24 NOTE — Telephone Encounter (Signed)
 Pt called requesting a call back concerning his referral for physical therapy. Pt states he had surgery 11/24 and no one form therapy has contacted him. I seen referral is still pending. Please contact pt about this matter at [phone].

## 2024-05-25 NOTE — Telephone Encounter (Signed)
 Left VM for pt to callback and discuss therapy. HH PT order already placed.

## 2024-05-25 NOTE — Addendum Note (Signed)
 Addended by: VICENTA EMMIE HERO on: 05/25/2024 12:07 PM   Modules accepted: Orders

## 2024-05-31 ENCOUNTER — Telehealth: Payer: Self-pay

## 2024-05-31 ENCOUNTER — Telehealth: Payer: Self-pay | Admitting: Orthopedic Surgery

## 2024-05-31 NOTE — Telephone Encounter (Signed)
 Patient left message late yesterday saying he hasn't heard anything about PT. He is asking for a return call from you.  Please call him at [phone]

## 2024-05-31 NOTE — Telephone Encounter (Signed)
 Dr. Onesimo pt [place] - spoke w/the pt, he stated in home PT was supposed to come yesterday and didn't.  He wants to know if they'll be coming today as he can't get around good.  [phone]

## 2024-05-31 NOTE — Telephone Encounter (Signed)
Who was he referred to??

## 2024-05-31 NOTE — Telephone Encounter (Signed)
 Called [facility] to check status, pt is to have a visit tomorrow. Called pt to see if he was aware and pt stated he had gotten it straight. No further concerns at this time.

## 2024-06-01 ENCOUNTER — Ambulatory Visit

## 2024-06-01 ENCOUNTER — Encounter: Payer: Self-pay | Admitting: Orthopedic Surgery

## 2024-06-01 ENCOUNTER — Ambulatory Visit: Admitting: Orthopedic Surgery

## 2024-06-01 DIAGNOSIS — Z96611 Presence of right artificial shoulder joint: Secondary | ICD-10-CM

## 2024-06-01 NOTE — Progress Notes (Signed)
 Orthopaedic Postop Note  Assessment: Ricky Hart is a 78 y.o. male s/p Right Reverse Shoulder Arthroplasty  DOS: 05/15/2024  Plan: Sutures were trimmed, steri strips were placed Ok to remove the abduction pillow; can stop using the sling around 4 weeks postop HH PT to begin today.  Initiate passive ROM Anticipated progression discussed, XR reviewed in clinic Follow up 4 weeks    Follow-up: Return in about 4 weeks (around 06/29/2024).  XR at next visit: Right shoulder  Subjective:  Chief Complaint  Patient presents with   Right reverse shoulder arthroplasty    DOS: 05/15/2024     History of Present Illness: Ricky Hart is a 78 y.o. male who presents following the above stated procedure.  Surgery was approximately 2 weeks ago.  He is doing well.  He stopped taking pain medicine about a week ago.  He has constipation.  He is scheduled with home health physical therapy later today.  He has no complaints at this time.  He feels as though he is doing well.  No issues with the incision.  Review of Systems: No fevers or chills No numbness or tingling No Chest Pain No shortness of breath Constipation   Objective: There were no vitals taken for this visit.  Physical Exam:  Alert and oriented, no acute distress  Surgical incision is healing well, no surrounding erythema or drainage Sensation intact in the axillary nerve distribution Active motion intact in the hand 2+ radial pulse Sensation intact throughout the hand Tolerates gentle range of motion of the shoulder - passive forward flexion to 90, passive abduction to 90   IMAGING: I personally ordered and reviewed the following images:  XR of the Right shoulder obtained in clinic today and demonstrate Reverse Shoulder Arthroplasty  with implants in good position.  No evidence of acute injury or subsidence of implants.  Alignment remains unchanged compared to immediate postop XR.  Impression: Right reverse shoulder  arthroplasty in good position   [name], MD 06/01/2024 9:46 AM

## 2024-06-16 ENCOUNTER — Telehealth: Payer: Self-pay | Admitting: Orthopedic Surgery

## 2024-06-16 NOTE — Telephone Encounter (Signed)
 Dr. Onesimo pt [place] - on 06/14/24 11:17am, Jori OT w/[facility] [phone] lvm requesting verbal orders for 1w5 and she also wants to know that activities are allowed for postop.

## 2024-06-21 NOTE — Telephone Encounter (Signed)
 Attempted to call to address, Dr. Onesimo post op protocol was put in therapy referral. Waited over 5 mis to talk to therapist and let them know it should be available but was in clinic and couldn't stay on hold.

## 2024-06-29 ENCOUNTER — Ambulatory Visit
Admission: RE | Admit: 2024-06-29 | Discharge: 2024-06-29 | Disposition: A | Discharge status: Home / Self Care | Attending: Orthopedic Surgery | Admitting: Orthopedic Surgery

## 2024-06-29 ENCOUNTER — Ambulatory Visit: Admitting: Orthopedic Surgery

## 2024-06-29 ENCOUNTER — Ambulatory Visit
Admission: RE | Admit: 2024-06-29 | Discharge: 2024-06-29 | Disposition: A | Discharge status: Home / Self Care | Source: Ambulatory Visit | Attending: Orthopedic Surgery | Admitting: Orthopedic Surgery

## 2024-06-29 ENCOUNTER — Other Ambulatory Visit: Payer: Self-pay | Admitting: Orthopedic Surgery

## 2024-06-29 DIAGNOSIS — M25511 Pain in right shoulder: Secondary | ICD-10-CM

## 2024-06-29 DIAGNOSIS — Z96611 Presence of right artificial shoulder joint: Secondary | ICD-10-CM

## 2024-06-29 MED ORDER — DICLOFENAC SODIUM 50 MG PO TBEC: 50.0000 mg | DELAYED_RELEASE_TABLET | Freq: Two times a day (BID) | ORAL | 0 refills | Status: DC

## 2024-06-29 NOTE — Progress Notes (Signed)
 Orthopaedic Postop Note  Assessment: Ricky Hart is a 79 y.o. male s/p Right Reverse Shoulder Arthroplasty  DOS: 05/15/2024  Plan: Mr. Merle is doing well.  His motion is improving.  He has been using the sling, and I have told him that he does not need to use the sling any further.  We will place a referral for outpatient physical therapy.  Radiographs remain stable.  If he has any further issues, he will contact the clinic.  Otherwise, I will see him back in approximately 6 weeks.   Follow-up: Return in about 6 weeks (around 08/10/2024).  XR at next visit: Right shoulder  Subjective:  Chief Complaint  Patient presents with   Postop    Right reverse shoulder arthroplasty    History of Present Illness: Ricky Hart is a 79 y.o. male who presents following the above stated procedure.  Surgery was approximately 6 weeks ago.  He feels good at this time.  He has been working with home health physical therapy.  He continues to use a sling.  Therapy stopped coming to his house about a week ago.  He denies numbness and tingling.  He has no issues with the surgical incision.   Review of Systems: No fevers or chills No numbness or tingling No Chest Pain No shortness of breath Constipation   Objective: There were no vitals taken for this visit.  Physical Exam:  Alert and oriented, no acute distress  Surgical incision is healing.  No surrounding erythema or drainage.  Sensation is intact in the axillary nerve distribution.  He tolerates passive forward flexion to 160 degrees.  Passive abduction to 100 degrees.  He is unable to get to his lower back, but this is improving.  Fingers are warm and well-perfused.  IMAGING: I personally ordered and reviewed the following images:  X-rays of the right shoulder were obtained in clinic today.  These are compared to available x-rays.  There is been no change in overall alignment.  No subsidence.  No acute fractures.  No lucency around the  prosthesis.  Shoulder is reduced.  No bony lesions.  Impression: Stable right reverse shoulder arthroplasty   [name], MD 06/30/2024 8:42 AM

## 2024-06-29 NOTE — Patient Instructions (Signed)
 You do not need to continue using the sling  Medications as needed  Continue PT  Follow up in 6 weeks

## 2024-06-30 ENCOUNTER — Encounter: Payer: Self-pay | Admitting: Orthopedic Surgery

## 2024-07-26 ENCOUNTER — Other Ambulatory Visit: Payer: Self-pay | Admitting: Orthopedic Surgery

## 2024-08-17 ENCOUNTER — Ambulatory Visit: Admitting: Orthopedic Surgery
